# Patient Record
Sex: Female | Born: 1960
Health system: Southern US, Community
[De-identification: ages and names within clinical notes are randomized; demographics above are authoritative.]

## PROBLEM LIST (undated history)

## (undated) DIAGNOSIS — K760 Fatty (change of) liver, not elsewhere classified: Secondary | ICD-10-CM

## (undated) DIAGNOSIS — D649 Anemia, unspecified: Secondary | ICD-10-CM

## (undated) DIAGNOSIS — K8689 Other specified diseases of pancreas: Secondary | ICD-10-CM

## (undated) DIAGNOSIS — K219 Gastro-esophageal reflux disease without esophagitis: Secondary | ICD-10-CM

## (undated) DIAGNOSIS — R011 Cardiac murmur, unspecified: Secondary | ICD-10-CM

## (undated) DIAGNOSIS — E785 Hyperlipidemia, unspecified: Secondary | ICD-10-CM

## (undated) DIAGNOSIS — E039 Hypothyroidism, unspecified: Secondary | ICD-10-CM

## (undated) DIAGNOSIS — E119 Type 2 diabetes mellitus without complications: Secondary | ICD-10-CM

## (undated) DIAGNOSIS — E559 Vitamin D deficiency, unspecified: Secondary | ICD-10-CM

## (undated) DIAGNOSIS — M8580 Other specified disorders of bone density and structure, unspecified site: Secondary | ICD-10-CM

## (undated) HISTORY — DX: Other specified diseases of pancreas: K86.89

## (undated) HISTORY — DX: Vitamin D deficiency, unspecified: E55.9

## (undated) HISTORY — DX: Hyperlipidemia, unspecified: E78.5

## (undated) HISTORY — DX: Cardiac murmur, unspecified: R01.1

## (undated) HISTORY — PX: TUBAL LIGATION: SHX77

## (undated) HISTORY — DX: Gastro-esophageal reflux disease without esophagitis: K21.9

## (undated) HISTORY — DX: Fatty (change of) liver, not elsewhere classified: K76.0

---

## 2015-05-08 ENCOUNTER — Encounter: Payer: Self-pay | Admitting: Family

## 2015-05-08 ENCOUNTER — Ambulatory Visit (INDEPENDENT_AMBULATORY_CARE_PROVIDER_SITE_OTHER): Payer: BLUE CROSS/BLUE SHIELD | Admitting: Family

## 2015-05-08 VITALS — BP 110/78 | HR 73 | Temp 97.8°F | Resp 16 | Ht 62.5 in | Wt 167.2 lb

## 2015-05-08 DIAGNOSIS — R312 Other microscopic hematuria: Secondary | ICD-10-CM

## 2015-05-08 DIAGNOSIS — R3129 Other microscopic hematuria: Secondary | ICD-10-CM

## 2015-05-08 DIAGNOSIS — R109 Unspecified abdominal pain: Secondary | ICD-10-CM

## 2015-05-08 LAB — POCT URINALYSIS DIPSTICK
Bilirubin, UA: NEGATIVE
Glucose, UA: NEGATIVE
KETONES UA: NEGATIVE
Nitrite, UA: NEGATIVE
PH UA: 6.5
SPEC GRAV UA: 1.01
Urobilinogen, UA: 0.2

## 2015-05-08 NOTE — Assessment & Plan Note (Signed)
I suspect that abdominal pain is due to chronic constipation. We discussed increasing water intake, fresh fruits and veggies. Add some prn miralax as noted in AVS. Discussed titrating miralax with goal of 1 soft BM/day. Back off if diarrhea occurs.  If symptoms persist after constipation is treated, plan CT abd/pelvis.  I have asked the pt to follow up in 2 weeks.

## 2015-05-08 NOTE — Progress Notes (Signed)
Pre visit review using our clinic review tool, if applicable. No additional management support is needed unless otherwise documented below in the visit note. 

## 2015-05-08 NOTE — Progress Notes (Signed)
Subjective:    Patient ID: Bailey Harris, female    DOB: 11/05/1961, 54 y.o.   MRN: [id number]  HPI  Bailey Harris is a 54 yr old female who presents today to establish care. Presents with husband who assists in spanish translation. Pt reports chief complaint of left sided abdominal pain.  Pain has been present x 9 months.  Abdominal pain is intermittent. Reports that pain seems to be worse at night and after eating. Reports that pain is stinging in nature.  Reports that she does not take anything for the pain. Denies associated dysuria.  Reports that she does have hx of intermittent constipation.  Notes that pain can be worse if she is constipated.  Reports BM every every 2-3 days. Pain seems to be worsened by red meat. Admits to a diet lacking in fresh fruit/veggies. She reports that has seen blood on the toilet tissue 2-3 times in the past year. She reports + hx of hemorrhoids.  Reports normal colo 1 year ago.   Reports joint pain and muscle cramping x 6 mos.  Also reports bilateral hand pain in fingers.  She used to work as a Secretary/administrator. Reports that she stays busy with housework. On her feet.  Has some mild cramping in the legs and feet.  Notes that the muscle pain is worse now than when she stopped working due to moving from [place] one year ago.     Anxiety-  Reports + anxiety.  Occurs when she thinks about her parents who are deceased and brothers who are far.  Does not interfere with day to day. Denies depression.  Review of Systems  Constitutional: Negative for unexpected weight change.  HENT: Negative for rhinorrhea.   Respiratory: Negative for cough.   Gastrointestinal: Positive for constipation.  Genitourinary: Negative for dysuria.  Musculoskeletal: Positive for arthralgias.       Hands  Skin: Negative for rash.       Reports dry skin  Neurological: Positive for dizziness.  Hematological: Negative for adenopathy.  Psychiatric/Behavioral:       Reports mild swollen glands sometimes  in the AM then "goes away."    History reviewed. No pertinent past medical history.  History   Social History  . Marital Status: Married    Spouse Name: N/A  . Number of Children: N/A  . Years of Education: N/A   Occupational History  . Not on file.   Social History Main Topics  . Smoking status: Never Smoker   . Smokeless tobacco: Not on file  . Alcohol Use: No  . Drug Use: No  . Sexual Activity: Not on file   Other Topics Concern  . 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 Cesarean section      Family History  Problem Relation Age of Onset  . Cancer Mother     ?type  . Cancer Father     throat cancer    No Known Allergies  No current outpatient prescriptions on file prior to visit.   No current facility-administered medications on file prior to visit.  BP 110/78 mmHg  Pulse 73  Temp(Src) 97.8 F (36.6 C) (Oral)  Resp 16  Ht 5' 2.5" (1.588 m)  Wt 167 lb 3.2 oz (75.841 kg)  BMI 30.07 kg/m2  SpO2 98%  LMP 12/30/2010       Objective:   Physical Exam  Constitutional: She is oriented to person, place, and time. She appears well-developed and well-nourished.  HENT:  Head: Normocephalic and atraumatic.  Eyes: No scleral icterus.  Cardiovascular: Normal rate, regular rhythm and normal heart sounds.   No murmur heard. Pulmonary/Chest: Effort normal and breath sounds normal. No respiratory distress. She has no wheezes.  Abdominal: Soft. She exhibits no distension.  + tenderness to palpation LUQ and LLQ.  No palpable masses.   Musculoskeletal: She exhibits no edema.  Lymphadenopathy:    She has no cervical adenopathy.  Neurological: She is alert and oriented to person,  place, and time.  Skin: Skin is warm and dry.  Psychiatric: She has a normal mood and affect. Her behavior is normal. Judgment and thought content normal.          Assessment & Plan:  Plan RA/ANA/ESR next visit- lab is currently closed.

## 2015-05-08 NOTE — Patient Instructions (Signed)
Take miralax 1 cap in 8 ounce of water or juice today, then 1 tablespoon once daily as needed with goal of 1 soft BM a day. Follow up in 2 weeks.

## 2015-05-09 LAB — URINALYSIS, ROUTINE W REFLEX MICROSCOPIC
Bilirubin Urine: NEGATIVE
Ketones, ur: NEGATIVE
NITRITE: NEGATIVE
Specific Gravity, Urine: 1.015 (ref 1.000–1.030)
Total Protein, Urine: NEGATIVE
URINE GLUCOSE: NEGATIVE
UROBILINOGEN UA: 0.2 (ref 0.0–1.0)
pH: 7 (ref 5.0–8.0)

## 2015-05-11 ENCOUNTER — Encounter: Payer: Self-pay | Admitting: Family

## 2015-05-11 LAB — URINE CULTURE: Colony Count: 25000

## 2015-05-22 ENCOUNTER — Encounter: Payer: Self-pay | Admitting: Family

## 2015-05-22 ENCOUNTER — Ambulatory Visit (INDEPENDENT_AMBULATORY_CARE_PROVIDER_SITE_OTHER): Payer: BLUE CROSS/BLUE SHIELD | Admitting: Family

## 2015-05-22 VITALS — BP 110/60 | HR 72 | Temp 98.0°F | Resp 16 | Ht 62.5 in | Wt 171.0 lb

## 2015-05-22 DIAGNOSIS — Z0000 Encounter for general adult medical examination without abnormal findings: Secondary | ICD-10-CM

## 2015-05-22 DIAGNOSIS — M2550 Pain in unspecified joint: Secondary | ICD-10-CM

## 2015-05-22 DIAGNOSIS — R1084 Generalized abdominal pain: Secondary | ICD-10-CM

## 2015-05-22 LAB — POCT URINE HCG BY VISUAL COLOR COMPARISON TESTS: Preg Test, Ur: NEGATIVE

## 2015-05-22 NOTE — Progress Notes (Signed)
   Subjective:    Patient ID: Bailey Harris, female    DOB: July 03, 1961, 54 y.o.   MRN: [id number]  HPI  Bailey Harris is a 54 yr old female who presents today with her husband for follow up of her abdominal pain.  She was seen on 05/08/15 and described intermittent left sided abdominal pain.  We gave her recommendations for constipation to see if this would help.  She reports that she has been using the miralax as needed and notes that her left sided abdominal pain is much improved.  She does report hx of gallstones and notes some intermittent RUQ pain.   Reports colo 18 months ago and this was normal per patient.  She reports that she continues to have pain in hands/fingers, muscle cramping.   Review of Systems    see HPI  No past medical history on file.  History   Social History  . Marital Status: Married    Spouse Name: N/A  . Number of Children: N/A  . Years of Education: N/A   Occupational History  . Not on file.   Social History Main Topics  . Smoking status: Never Smoker   . Smokeless tobacco: Not on file  . Alcohol Use: No  . Drug Use: No  . Sexual Activity: Not on file   Other Topics Concern  . 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 Cesarean section      Family History  Problem Relation Age of Onset  . Cancer Mother     ?type  . Cancer Father     throat cancer    No Known Allergies  No current outpatient prescriptions on file prior to visit.   No current facility-administered medications on file prior to visit.    BP 110/60 mmHg  Pulse 72  Temp(Src) 98 F (36.7 C) (Oral)  Resp 16  Ht 5' 2.5" (1.588 m)  Wt 171 lb (77.565 kg)  BMI 30.76 kg/m2  SpO2 97%  LMP  12/30/2010    Objective:   Physical Exam  Constitutional: She is oriented to person, place, and time. She appears well-developed and well-nourished.  HENT:  Head: Normocephalic and atraumatic.  Cardiovascular: Normal rate, regular rhythm and normal heart sounds.   No murmur heard. Pulmonary/Chest: Effort normal and breath sounds normal. No respiratory distress. She has no wheezes.  Abdominal: Soft. Bowel sounds are normal. She exhibits no distension.  + murphy's sign Diffuse abdominal tenderness is noted.   Musculoskeletal: She exhibits no edema.  Neurological: She is alert and oriented to person, place, and time.  Psychiatric: She has a normal mood and affect. Her behavior is normal. Judgment and thought content normal.          Assessment & Plan:

## 2015-05-22 NOTE — Patient Instructions (Signed)
You will be contacted about scheduling your mammogram and CT scan tests.  Please schedule lab draw at the front desk. Schedule a complete physical at your convenience this summer.  Continue miralax as needed.

## 2015-05-22 NOTE — Progress Notes (Signed)
Pre visit review using our clinic review tool, if applicable. No additional management support is needed unless otherwise documented below in the visit note. 

## 2015-05-22 NOTE — Assessment & Plan Note (Signed)
She is still quite tender today despite improvement in her constipation symptoms. Recommend CT abdomen to look at GB and abd.  She is agreeable.

## 2015-05-22 NOTE — Assessment & Plan Note (Signed)
Obtain ANA, RA, ESR.  

## 2015-05-22 NOTE — Addendum Note (Signed)
Addended by: Kelle Darting A on: 05/22/2015 07:03 PM   Modules accepted: Orders

## 2015-05-30 ENCOUNTER — Ambulatory Visit (HOSPITAL_BASED_OUTPATIENT_CLINIC_OR_DEPARTMENT_OTHER)
Admission: RE | Admit: 2015-05-30 | Discharge: 2015-05-30 | Disposition: A | Discharge status: Home / Self Care | Payer: BLUE CROSS/BLUE SHIELD | Source: Ambulatory Visit | Attending: Family | Admitting: Family

## 2015-05-30 ENCOUNTER — Encounter (HOSPITAL_BASED_OUTPATIENT_CLINIC_OR_DEPARTMENT_OTHER): Payer: Self-pay

## 2015-05-30 DIAGNOSIS — K760 Fatty (change of) liver, not elsewhere classified: Secondary | ICD-10-CM | POA: Diagnosis not present

## 2015-05-30 DIAGNOSIS — R1011 Right upper quadrant pain: Secondary | ICD-10-CM | POA: Insufficient documentation

## 2015-05-30 DIAGNOSIS — Z1231 Encounter for screening mammogram for malignant neoplasm of breast: Secondary | ICD-10-CM | POA: Diagnosis present

## 2015-05-30 DIAGNOSIS — Z0000 Encounter for general adult medical examination without abnormal findings: Secondary | ICD-10-CM

## 2015-05-30 DIAGNOSIS — K838 Other specified diseases of biliary tract: Secondary | ICD-10-CM | POA: Insufficient documentation

## 2015-05-30 DIAGNOSIS — K5710 Diverticulosis of small intestine without perforation or abscess without bleeding: Secondary | ICD-10-CM | POA: Insufficient documentation

## 2015-05-30 DIAGNOSIS — R1084 Generalized abdominal pain: Secondary | ICD-10-CM

## 2015-05-30 MED ORDER — IOHEXOL 300 MG/ML  SOLN
100.0000 mL | Freq: Once | INTRAMUSCULAR | Status: AC | PRN
  Administered 2015-05-30: 100 mL via INTRAVENOUS

## 2015-05-31 ENCOUNTER — Telehealth: Payer: Self-pay | Admitting: Family

## 2015-05-31 ENCOUNTER — Encounter: Payer: Self-pay | Admitting: Family

## 2015-05-31 NOTE — Telephone Encounter (Signed)
Left message on voicemail for pt to return my call.

## 2015-05-31 NOTE — Telephone Encounter (Signed)
CT abd shows fatty liver. (rec low fat/low cholesterol diet). Gallbladder looks good. No gallstones noted.  If RUQ pain persists, I would recommend a HIDA scan to check gallbladder function.

## 2015-06-05 NOTE — Telephone Encounter (Signed)
Left message for pt to return my call.

## 2015-06-12 NOTE — Telephone Encounter (Signed)
Notified pt's husband and he voices understanding. 

## 2015-07-04 ENCOUNTER — Telehealth: Payer: Self-pay | Admitting: Family

## 2015-07-04 NOTE — Telephone Encounter (Signed)
pre visit letter mailed 07/04/15 °

## 2015-07-24 ENCOUNTER — Telehealth: Payer: Self-pay | Admitting: Family

## 2015-07-24 ENCOUNTER — Encounter: Payer: BLUE CROSS/BLUE SHIELD | Admitting: Family

## 2015-07-24 DIAGNOSIS — Z0289 Encounter for other administrative examinations: Secondary | ICD-10-CM

## 2015-07-28 NOTE — Telephone Encounter (Signed)
Yes please

## 2015-07-28 NOTE — Telephone Encounter (Signed)
Pt was no show 07/24/15 5:30pm, cpe appt, pt rescheduled for 08/07/15, charge for no show?

## 2015-08-07 ENCOUNTER — Telehealth: Payer: Self-pay | Admitting: Family

## 2015-08-07 ENCOUNTER — Encounter: Payer: BLUE CROSS/BLUE SHIELD | Admitting: Family

## 2015-08-07 NOTE — Telephone Encounter (Signed)
Pt will not be here tonight 08/07/15 5:15pm, 08/07/15 4:45pm family member left msg to cancel appt - called and spoke to family and pts ins needs to be straigtened out before they can come in, charge for no show?

## 2015-08-07 NOTE — Telephone Encounter (Signed)
No

## 2015-12-14 ENCOUNTER — Telehealth: Payer: Self-pay | Admitting: Behavioral Health

## 2015-12-14 NOTE — Telephone Encounter (Signed)
Unable to reach patient at time of Pre-Visit Call.  Left message for patient to return call when available.    

## 2015-12-15 ENCOUNTER — Encounter: Payer: Self-pay | Admitting: Family

## 2015-12-15 ENCOUNTER — Ambulatory Visit (INDEPENDENT_AMBULATORY_CARE_PROVIDER_SITE_OTHER): Payer: BLUE CROSS/BLUE SHIELD | Admitting: Family

## 2015-12-15 VITALS — BP 110/58 | HR 63 | Temp 98.2°F | Resp 16 | Ht 62.5 in | Wt 168.4 lb

## 2015-12-15 DIAGNOSIS — G5601 Carpal tunnel syndrome, right upper limb: Secondary | ICD-10-CM

## 2015-12-15 DIAGNOSIS — Z1159 Encounter for screening for other viral diseases: Secondary | ICD-10-CM

## 2015-12-15 DIAGNOSIS — Z0001 Encounter for general adult medical examination with abnormal findings: Secondary | ICD-10-CM

## 2015-12-15 DIAGNOSIS — G5600 Carpal tunnel syndrome, unspecified upper limb: Secondary | ICD-10-CM | POA: Insufficient documentation

## 2015-12-15 DIAGNOSIS — Z23 Encounter for immunization: Secondary | ICD-10-CM | POA: Diagnosis not present

## 2015-12-15 DIAGNOSIS — Z0000 Encounter for general adult medical examination without abnormal findings: Secondary | ICD-10-CM | POA: Diagnosis not present

## 2015-12-15 LAB — HEPATIC FUNCTION PANEL
ALK PHOS: 87 U/L (ref 39–117)
ALT: 52 U/L — ABNORMAL HIGH (ref 0–35)
AST: 31 U/L (ref 0–37)
Albumin: 4.4 g/dL (ref 3.5–5.2)
BILIRUBIN DIRECT: 0.2 mg/dL (ref 0.0–0.3)
Total Bilirubin: 1.3 mg/dL — ABNORMAL HIGH (ref 0.2–1.2)
Total Protein: 7.7 g/dL (ref 6.0–8.3)

## 2015-12-15 LAB — BASIC METABOLIC PANEL
BUN: 10 mg/dL (ref 6–23)
CALCIUM: 9.6 mg/dL (ref 8.4–10.5)
CO2: 28 mEq/L (ref 19–32)
Chloride: 107 mEq/L (ref 96–112)
Creatinine, Ser: 0.59 mg/dL (ref 0.40–1.20)
GFR: 113.21 mL/min (ref >60.00)
GLUCOSE: 124 mg/dL — AB (ref 70–99)
Potassium: 4.3 mEq/L (ref 3.5–5.1)
Sodium: 142 mEq/L (ref 135–145)

## 2015-12-15 LAB — CBC WITH DIFFERENTIAL/PLATELET
BASOS ABS: 0 10*3/uL (ref 0.0–0.1)
Basophils Relative: 0.5 % (ref 0.0–3.0)
EOS ABS: 0.1 10*3/uL (ref 0.0–0.7)
Eosinophils Relative: 1.4 % (ref 0.0–5.0)
HCT: 40.7 % (ref 36.0–46.0)
Hemoglobin: 13.6 g/dL (ref 12.0–15.0)
LYMPHS ABS: 1.4 10*3/uL (ref 0.7–4.0)
Lymphocytes Relative: 32 % (ref 12.0–46.0)
MCHC: 33.4 g/dL (ref 30.0–36.0)
MCV: 92.8 fl (ref 78.0–100.0)
MONO ABS: 0.4 10*3/uL (ref 0.1–1.0)
Monocytes Relative: 9.2 % (ref 3.0–12.0)
NEUTROS PCT: 56.9 % (ref 43.0–77.0)
Neutro Abs: 2.5 10*3/uL (ref 1.4–7.7)
Platelets: 247 10*3/uL (ref 150.0–400.0)
RBC: 4.39 Mil/uL (ref 3.87–5.11)
RDW: 12.4 % (ref 11.5–15.5)
WBC: 4.4 10*3/uL (ref 4.0–10.5)

## 2015-12-15 LAB — URINALYSIS, ROUTINE W REFLEX MICROSCOPIC
BILIRUBIN URINE: NEGATIVE
KETONES UR: NEGATIVE
Nitrite: POSITIVE — AB
Specific Gravity, Urine: 1.025 (ref 1.000–1.030)
TOTAL PROTEIN, URINE-UPE24: NEGATIVE
Urine Glucose: NEGATIVE
Urobilinogen, UA: 0.2 (ref 0.0–1.0)
pH: 6 (ref 5.0–8.0)

## 2015-12-15 LAB — LIPID PANEL
CHOLESTEROL: 211 mg/dL — AB (ref 0–200)
HDL: 30.6 mg/dL — ABNORMAL LOW (ref >39.00)
LDL CALC: 148 mg/dL — AB (ref 0–99)
NonHDL: 180.66
Total CHOL/HDL Ratio: 7
Triglycerides: 164 mg/dL — ABNORMAL HIGH (ref 0.0–149.0)
VLDL: 32.8 mg/dL (ref 0.0–40.0)

## 2015-12-15 LAB — TSH: TSH: 4.59 u[IU]/mL — ABNORMAL HIGH (ref 0.35–4.50)

## 2015-12-15 MED ORDER — ASPIRIN EC 81 MG PO TBEC: 81.0000 mg | DELAYED_RELEASE_TABLET | Freq: Every day | ORAL | Status: DC

## 2015-12-15 MED ORDER — MELOXICAM 7.5 MG PO TABS: 7.5000 mg | ORAL_TABLET | Freq: Every day | ORAL | Status: DC

## 2015-12-15 NOTE — Assessment & Plan Note (Addendum)
New. Trial of meloxicam and wrist brace.

## 2015-12-15 NOTE — Patient Instructions (Signed)
Please schedule dental exam. Wear right wrist brace while sleeping and as able during the day. Start meloxicam (anti-inflammatory) once daily to see if this helps with hand/wrist pain and numbness.  Let me know if symptoms are not improved in 1 month.

## 2015-12-15 NOTE — Progress Notes (Signed)
Pre visit review using our clinic review tool, if applicable. No additional management support is needed unless otherwise documented below in the visit note. 

## 2015-12-15 NOTE — Assessment & Plan Note (Signed)
Discussed importance of healthy diet and exercise. Obtain routine labs. Advised pt to add asa 81mg  once daily for CV risk reduction due to strong family hx of early stroke deaths. Obtain lipid panel.

## 2015-12-15 NOTE — Progress Notes (Signed)
Subjective:    Patient ID: Bailey Harris, female    DOB: February 07, 1961, 54 y.o.   MRN: [id number]  HPI  Patient presents today for complete physical.  Immunizations: tetanus up to date, flu shot today Diet: reports diet is "fair" Exercise: Not exercising.  Colonoscopy: 2015 Dexa: menopausal x 4 yrs Pap Smear: 2015- normal Mammogram: 5/16 [facility]- last February Dental- due  Wt Readings from Last 3 Encounters:  12/15/15 168 lb 6.4 oz (76.386 kg)  05/22/15 171 lb (77.565 kg)  05/08/15 167 lb 3.2 oz (75.841 kg)     Review of Systems  Constitutional: Negative for unexpected weight change.  Eyes:       Wears glasses  Respiratory: Negative for cough.   Cardiovascular: Negative for chest pain.  Gastrointestinal:       Some positional gerd symptoms  Genitourinary:       Mild intermittent dysuria.   Musculoskeletal:       Occasional low back pain  Skin:       Some dry skin  Neurological: Negative for headaches.  Hematological: Negative for adenopathy.  Psychiatric/Behavioral:       Denies depression/anxiety       History reviewed. No pertinent past medical history.  Social History   Social History  . Marital Status: Married    Spouse Name: N/A  . Number of Children: N/A  . Years of Education: N/A   Occupational History  . Not on file.   Social History Main Topics  . Smoking status: Never Smoker   . Smokeless tobacco: Not on file  . Alcohol Use: No  . Drug Use: No  . Sexual Activity: Not on file   Other Topics Concern  . 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 Cesarean section      Family History  Problem Relation Age of Onset  . Cancer Mother     ?type  . Cancer Father      throat cancer    No Known Allergies  No current outpatient prescriptions on file prior to visit.   No current facility-administered medications on file prior to visit.    BP 110/58 mmHg  Pulse 63  Temp(Src) 98.2 F (36.8 C) (Oral)  Resp 16  Ht 5' 2.5" (1.588 m)  Wt 168 lb 6.4 oz (76.386 kg)  BMI 30.29 kg/m2  SpO2 100%  LMP 12/30/2010    Objective:   Physical Exam Physical Exam  Constitutional: She is oriented to person, place, and time. She appears well-developed and well-nourished. No distress.  HENT:  Head: Normocephalic and atraumatic.  Right Ear: Tympanic membrane and ear canal normal.  Left Ear: Tympanic membrane and ear canal normal.  Mouth/Throat: Oropharynx is clear and moist.  Eyes: Pupils are equal, round, and reactive to light. No scleral icterus.  Neck: Normal range of motion. No thyromegaly present.  Cardiovascular: Normal rate and regular rhythm.   No murmur heard. Pulmonary/Chest: Effort normal and breath sounds normal. No respiratory distress. He has no wheezes. She has no rales. She exhibits no tenderness.  Abdominal: Soft. Bowel sounds are normal. He exhibits  no distension and no mass. There is no tenderness. There is no rebound and no guarding.  Musculoskeletal: She exhibits no edema.  Lymphadenopathy:    She has no cervical adenopathy.  Neurological: She is alert and oriented to person, place, and time. She has normal patellar reflexes. She exhibits normal muscle tone. Coordination normal. + tinel, + phalans- right side Skin: Skin is warm and dry.  Psychiatric: She has a normal mood and affect. Her behavior is normal. Judgment and thought content normal.  Breasts: Examined lying Right: Without masses, retractions, discharge or axillary adenopathy.  Left: Without masses, retractions, discharge or axillary adenopathy.           Assessment & Plan:          Assessment & Plan:  EKG tracing is personally reviewed.  EKG notes NSR.  No  acute changes.

## 2015-12-15 NOTE — Addendum Note (Signed)
Addended by: Debbrah Alar on: 12/15/2015 03:07 PM   Modules accepted: Miquel Dunn

## 2015-12-16 LAB — HEPATITIS C ANTIBODY: HCV Ab: NEGATIVE

## 2015-12-17 ENCOUNTER — Telehealth: Payer: Self-pay | Admitting: Family

## 2015-12-17 DIAGNOSIS — E039 Hypothyroidism, unspecified: Secondary | ICD-10-CM

## 2015-12-17 DIAGNOSIS — K760 Fatty (change of) liver, not elsewhere classified: Secondary | ICD-10-CM | POA: Insufficient documentation

## 2015-12-17 DIAGNOSIS — N390 Urinary tract infection, site not specified: Secondary | ICD-10-CM

## 2015-12-17 MED ORDER — NITROFURANTOIN MONOHYD MACRO 100 MG PO CAPS: 100.0000 mg | ORAL_CAPSULE | Freq: Two times a day (BID) | ORAL | Status: DC

## 2015-12-17 NOTE — Telephone Encounter (Signed)
Liver function test is mildly elevated.  Likely due to fatty liver.  Work on low fat/low cholesterol diet, exercise and weight  Loss.    Urine shows possible UTI.  Start macrodantin. Repeat UA in 1 month dx UTI,  Also, thyroid may be slightly underactive, I would like to repeat TSH, free t3, fre t4 in 1 month dx hypothyroid.

## 2015-12-17 NOTE — Telephone Encounter (Signed)
Hep C testing is negative.   Sugar is mildly elevated, please ask lab to add on a1C  Dx hyperglycemia. thanks

## 2015-12-18 ENCOUNTER — Other Ambulatory Visit (INDEPENDENT_AMBULATORY_CARE_PROVIDER_SITE_OTHER): Payer: BLUE CROSS/BLUE SHIELD

## 2015-12-18 DIAGNOSIS — R7309 Other abnormal glucose: Secondary | ICD-10-CM

## 2015-12-18 DIAGNOSIS — R739 Hyperglycemia, unspecified: Secondary | ICD-10-CM

## 2015-12-18 LAB — HEMOGLOBIN A1C: HEMOGLOBIN A1C: 6.6 % — AB (ref 4.6–6.5)

## 2015-12-18 NOTE — Telephone Encounter (Signed)
Add on form faxed to the lab. Awaiting result.

## 2015-12-18 NOTE — Telephone Encounter (Signed)
A1C shows + diabetes.  Advise pt on diabetic diet: specificallywork on avoiding concentrated sweets, and limiting white carbs (rice/bread/pasta/potatoes). Instead substitute whole grain versions with reasonable portions. Also, work on weight loss and exercise. Follow up in 3 months.

## 2015-12-18 NOTE — Telephone Encounter (Signed)
Left message for Heliodoro to return my call.

## 2015-12-19 ENCOUNTER — Telehealth: Payer: Self-pay | Admitting: Family

## 2015-12-19 NOTE — Telephone Encounter (Signed)
LVM for pt to return call/ To schedule Bone Density

## 2016-01-02 NOTE — Telephone Encounter (Signed)
Notified pt's spouse of below results and he voices understanding. Lab appt scheduled for 02/02/16 at 7am and future orders entered. 3 month f/u scheduled with Melissa for 04/05/16 at 7:45am. He requests that appts be mailed to him as he is currently driving. Below documentation mailed to pt/spouse.  Below results/instructions mailed. Pt's spouse will let us know if pt never completed antibiotic from 11/2015.

## 2016-02-02 ENCOUNTER — Other Ambulatory Visit (INDEPENDENT_AMBULATORY_CARE_PROVIDER_SITE_OTHER): Payer: BLUE CROSS/BLUE SHIELD

## 2016-02-02 ENCOUNTER — Telehealth: Payer: Self-pay | Admitting: Family

## 2016-02-02 DIAGNOSIS — R319 Hematuria, unspecified: Secondary | ICD-10-CM

## 2016-02-02 DIAGNOSIS — N390 Urinary tract infection, site not specified: Secondary | ICD-10-CM | POA: Diagnosis not present

## 2016-02-02 DIAGNOSIS — E039 Hypothyroidism, unspecified: Secondary | ICD-10-CM | POA: Diagnosis not present

## 2016-02-02 LAB — URINALYSIS, ROUTINE W REFLEX MICROSCOPIC
Bilirubin Urine: NEGATIVE
Ketones, ur: NEGATIVE
Nitrite: NEGATIVE
Specific Gravity, Urine: 1.025 (ref 1.000–1.030)
TOTAL PROTEIN, URINE-UPE24: NEGATIVE
Urine Glucose: NEGATIVE
Urobilinogen, UA: 0.2 (ref 0.0–1.0)
pH: 6 (ref 5.0–8.0)

## 2016-02-02 LAB — T4, FREE: FREE T4: 0.66 ng/dL (ref 0.60–1.60)

## 2016-02-02 LAB — T3, FREE: T3, Free: 3.2 pg/mL (ref 2.3–4.2)

## 2016-02-02 LAB — TSH: TSH: 4.22 u[IU]/mL (ref 0.35–4.50)

## 2016-02-02 MED ORDER — SULFAMETHOXAZOLE-TRIMETHOPRIM 800-160 MG PO TABS: 1.0000 | ORAL_TABLET | Freq: Two times a day (BID) | ORAL | Status: DC

## 2016-02-02 NOTE — Telephone Encounter (Signed)
UA shows blood, probable UTI. I would like to treat her with antibiotics.  Repeat UA in 2 weeks.  If still + blood, I will refer to urology at that time. rx sent for bactrim.

## 2016-02-02 NOTE — Telephone Encounter (Signed)
Notified pt's spouse and he voices understanding. Lab appt scheduled for 02/15/16 at 7am. Future order entered.

## 2016-02-15 ENCOUNTER — Other Ambulatory Visit (INDEPENDENT_AMBULATORY_CARE_PROVIDER_SITE_OTHER): Payer: BLUE CROSS/BLUE SHIELD

## 2016-02-15 DIAGNOSIS — R319 Hematuria, unspecified: Secondary | ICD-10-CM

## 2016-02-16 ENCOUNTER — Encounter: Payer: Self-pay | Admitting: Family

## 2016-02-16 LAB — URINALYSIS, MICROSCOPIC ONLY
Bacteria, UA: NONE SEEN [HPF]
Casts: NONE SEEN [LPF]
Crystals: NONE SEEN [HPF]
Yeast: NONE SEEN [HPF]

## 2016-02-16 LAB — URINALYSIS, ROUTINE W REFLEX MICROSCOPIC
BILIRUBIN URINE: NEGATIVE
Glucose, UA: NEGATIVE
Hgb urine dipstick: NEGATIVE
KETONES UR: NEGATIVE
NITRITE: NEGATIVE
Protein, ur: NEGATIVE
Specific Gravity, Urine: 1.024 (ref 1.001–1.035)
pH: 5.5 (ref 5.0–8.0)

## 2016-04-05 ENCOUNTER — Ambulatory Visit: Payer: BLUE CROSS/BLUE SHIELD | Admitting: Family

## 2016-04-22 ENCOUNTER — Ambulatory Visit (INDEPENDENT_AMBULATORY_CARE_PROVIDER_SITE_OTHER): Payer: BLUE CROSS/BLUE SHIELD | Admitting: Family

## 2016-04-22 ENCOUNTER — Encounter: Payer: Self-pay | Admitting: Family

## 2016-04-22 VITALS — BP 110/70 | HR 72 | Temp 98.3°F | Resp 16 | Ht 63.0 in | Wt 172.2 lb

## 2016-04-22 DIAGNOSIS — E785 Hyperlipidemia, unspecified: Secondary | ICD-10-CM | POA: Diagnosis not present

## 2016-04-22 DIAGNOSIS — E118 Type 2 diabetes mellitus with unspecified complications: Secondary | ICD-10-CM | POA: Insufficient documentation

## 2016-04-22 DIAGNOSIS — E119 Type 2 diabetes mellitus without complications: Secondary | ICD-10-CM

## 2016-04-22 DIAGNOSIS — Z0000 Encounter for general adult medical examination without abnormal findings: Secondary | ICD-10-CM

## 2016-04-22 DIAGNOSIS — M545 Low back pain, unspecified: Secondary | ICD-10-CM | POA: Insufficient documentation

## 2016-04-22 DIAGNOSIS — G5601 Carpal tunnel syndrome, right upper limb: Secondary | ICD-10-CM | POA: Diagnosis not present

## 2016-04-22 HISTORY — DX: Type 2 diabetes mellitus with unspecified complications: E11.8

## 2016-04-22 MED ORDER — MELOXICAM 7.5 MG PO TABS: 7.5000 mg | ORAL_TABLET | Freq: Every day | ORAL | Status: DC

## 2016-04-22 NOTE — Assessment & Plan Note (Signed)
Discussed healthy diet, exercise.  Obtain a1c, urine microalbumin.

## 2016-04-22 NOTE — Progress Notes (Signed)
Subjective:    Patient ID: Bailey Harris, female    DOB: August 03, 1961, 55 y.o.   MRN: [id number]  HPI  Bailey Harris is a 55 yr old female who presents today for cpx.   1) DM2- Trying to work on diet.   Lab Results  Component Value Date   HGBA1C 6.6* 12/18/2015   Lab Results  Component Value Date   LDLCALC 148* 12/15/2015   CREATININE 0.59 12/15/2015   2) Back pain- reports 3 week history of low back pain.  Has been bothering her off/on x 2 months.  Uses advil prn.  Denies bowel/bladder incontinence.  Occasional pressure in her lower back especially with standing.    3) R hand pain- reports some numbness/pain into the right middle finger. Using wrist brace with some improvement, not resolved.   Review of Systems    see HPI  No past medical history on file.   Social History   Social History  . Marital Status: Married    Spouse Name: N/A  . Number of Children: N/A  . Years of Education: N/A   Occupational History  . Not on file.   Social History Main Topics  . Smoking status: Never Smoker   . Smokeless tobacco: Not on file  . Alcohol Use: No  . Drug Use: No  . Sexual Activity: Not on file   Other Topics Concern  . 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 Cesarean section      Family History  Problem Relation Age of Onset  . Cancer Mother     died at 83 from [place] (pt unsure what type of cancer)  . Cancer Father     throat cancer  . CVA Father     died age 47  . CVA Sister     10- died  . CVA Brother     5 died    No Known Allergies  Current Outpatient Prescriptions on File Prior to Visit  Medication Sig Dispense Refill  . aspirin EC 81 MG tablet Take 1 tablet (81 mg total)  by mouth daily.     No current facility-administered medications on file prior to visit.    BP 110/70 mmHg  Pulse 72  Temp(Src) 98.3 F (36.8 C) (Oral)  Resp 16  Ht _0  (1.6 m)  Wt 172 lb 3.2 oz (78.109 kg)  BMI 30.51 kg/m2  SpO2 97%  LMP 12/30/2010    Objective:   Physical Exam  Constitutional: She is oriented to person, place, and time. She appears well-developed and well-nourished.  Cardiovascular: Normal rate, regular rhythm and normal heart sounds.   No murmur heard. Pulmonary/Chest: Effort normal and breath sounds normal. No respiratory distress. She has no wheezes.  Musculoskeletal:       Lumbar back: She exhibits no tenderness.  +left  low back tenderness to palpation.    Neurological: She is alert and oriented to person, place, and time.  Psychiatric: She has a normal mood and affect. Her behavior is normal. Judgment and thought content normal.  Assessment & Plan:

## 2016-04-22 NOTE — Progress Notes (Signed)
Pre visit review using our clinic review tool, if applicable. No additional management support is needed unless otherwise documented below in the visit note. 

## 2016-04-22 NOTE — Assessment & Plan Note (Signed)
Obtain follow up lipid panel. If LDL remains >100 plan to begin statin.

## 2016-04-22 NOTE — Assessment & Plan Note (Signed)
Musculoskeletal pain. Trial of meloxicam.

## 2016-04-22 NOTE — Patient Instructions (Signed)
Please schedule a lab draw at the front desk. Begin meloxicam for back pain and right hand (carpal tunnel). Wear right wrist brace at night and as able during the day. Call me if back pain or right hand pain is not improved in 2 weeks.

## 2016-04-22 NOTE — Assessment & Plan Note (Signed)
Advised pt on trial of meloxicam, continue brace, if symptoms worsen or do not improve plan referral to hand specialist.

## 2016-05-01 ENCOUNTER — Ambulatory Visit (INDEPENDENT_AMBULATORY_CARE_PROVIDER_SITE_OTHER): Payer: BLUE CROSS/BLUE SHIELD | Admitting: Family Medicine

## 2016-05-01 ENCOUNTER — Encounter: Payer: Self-pay | Admitting: Family Medicine

## 2016-05-01 VITALS — BP 123/76 | HR 70 | Ht 63.0 in | Wt 168.0 lb

## 2016-05-01 DIAGNOSIS — Z0000 Encounter for general adult medical examination without abnormal findings: Secondary | ICD-10-CM

## 2016-05-01 DIAGNOSIS — Z1239 Encounter for other screening for malignant neoplasm of breast: Secondary | ICD-10-CM

## 2016-05-01 DIAGNOSIS — Z1151 Encounter for screening for human papillomavirus (HPV): Secondary | ICD-10-CM

## 2016-05-01 DIAGNOSIS — Z124 Encounter for screening for malignant neoplasm of cervix: Secondary | ICD-10-CM

## 2016-05-01 DIAGNOSIS — Z01419 Encounter for gynecological examination (general) (routine) without abnormal findings: Secondary | ICD-10-CM

## 2016-05-01 NOTE — Progress Notes (Signed)
Interpreter [phone] used for visit. Kathrene Alu RN BSN

## 2016-05-01 NOTE — Progress Notes (Addendum)
  Subjective:    Bailey Harris is a 55 y.o. female who presents for an annual exam. The patient has no complaints today. The patient is sexually active. GYN screening history: last pap: was normal and last mammogram: was normal. The patient wears seatbelts: yes. The patient participates in regular exercise: no. Has the patient ever been transfused or tattooed?: no.   Family history of Ovarian Cancer  Menstrual History: OB History    Gravida Para Term Preterm AB TAB SAB Ectopic Multiple Living   4 3   1  1   3        Patient's last menstrual period was 12/30/2010.    The following portions of the patient's history were reviewed and updated as appropriate: allergies, current medications, past family history, past medical history, past social history, past surgical history and problem list.  Review of Systems Pertinent items are noted in HPI.    Objective:      General Appearance:    Alert, cooperative, no distress, appears stated age  Head:    Normocephalic, without obvious abnormality, atraumatic  Eyes:    PERRL, conjunctiva/corneas clear, EOM's intact, fundi    benign, both eyes  Ears:    Normal TM's and external ear canals, both ears  Neck:   Supple, symmetrical, trachea midline, no adenopathy;    thyroid:  no enlargement/tenderness/nodules; no carotid   bruit or JVD  Back:     Symmetric, no curvature, ROM normal, no CVA tenderness  Lungs:     Clear to auscultation bilaterally, respirations unlabored  Chest Wall:    No tenderness or deformity   Heart:    Regular rate and rhythm, S1 and S2 normal, no murmur, rub   or gallop  Breast Exam:    No tenderness, masses, or nipple abnormality  Abdomen:     Soft, non-tender, bowel sounds active all four quadrants,    no masses, no organomegaly  Genitalia:    Normal female without lesion, discharge or tenderness.            Normal vaginal rugae, no lesions.  Cervix normal in           appearance with slightly stenosis.  Uterus not  enlarged.      Adnexa normal, without masses or fullness.  Extremities:   Extremities normal, atraumatic, no cyanosis or edema  Pulses:   2+ and symmetric all extremities  Skin:   Skin color, texture, turgor normal, no rashes or lesions  Lymph nodes:   Cervical, supraclavicular, and axillary nodes normal  Neurologic:   CNII-XII intact, normal strength, sensation and reflexes    throughout   .    Assessment:    Healthy female exam.    Plan:     All questions answered. Follow up in 1 year. Mammogram. Pap smear.   Family Cancer survey given to patient to see if qualifies for genetic screening

## 2016-05-01 NOTE — Addendum Note (Signed)
Addended by: Phill Myron on: 05/01/2016 03:30 PM   Modules accepted: Orders

## 2016-05-02 ENCOUNTER — Other Ambulatory Visit: Payer: BLUE CROSS/BLUE SHIELD

## 2016-05-02 LAB — HEMOGLOBIN A1C: Hgb A1c MFr Bld: 6.7 % — ABNORMAL HIGH (ref 4.6–6.5)

## 2016-05-02 LAB — LIPID PANEL
CHOLESTEROL: 182 mg/dL (ref 0–200)
HDL: 33.4 mg/dL — ABNORMAL LOW (ref >39.00)
LDL CALC: 123 mg/dL — AB (ref 0–99)
NonHDL: 148.15
Total CHOL/HDL Ratio: 5
Triglycerides: 126 mg/dL (ref 0.0–149.0)
VLDL: 25.2 mg/dL (ref 0.0–40.0)

## 2016-05-02 LAB — MICROALBUMIN / CREATININE URINE RATIO
Creatinine,U: 138.8 mg/dL
MICROALB UR: 0.7 mg/dL (ref 0.0–1.9)
Microalb Creat Ratio: 0.5 mg/g (ref 0.0–30.0)

## 2016-05-03 ENCOUNTER — Telehealth: Payer: Self-pay | Admitting: Family

## 2016-05-03 LAB — CYTOLOGY - PAP

## 2016-05-03 MED ORDER — ATORVASTATIN CALCIUM 20 MG PO TABS: 20.0000 mg | ORAL_TABLET | Freq: Every day | ORAL | Status: DC

## 2016-05-03 NOTE — Telephone Encounter (Signed)
Notified pt's spouse and he voices understanding. 

## 2016-05-03 NOTE — Telephone Encounter (Signed)
Please let pt's husband know that I reviewed her lab work. Cholesterol is elevated. Advise pt to begin atorvastatin. Also, sugar still showing diabetes but is at goal.  Continue diabetic diet, exercise.

## 2016-05-07 ENCOUNTER — Ambulatory Visit (HOSPITAL_BASED_OUTPATIENT_CLINIC_OR_DEPARTMENT_OTHER)
Admission: RE | Admit: 2016-05-07 | Discharge: 2016-05-07 | Disposition: A | Discharge status: Home / Self Care | Payer: BLUE CROSS/BLUE SHIELD | Source: Ambulatory Visit | Attending: Family Medicine | Admitting: Family Medicine

## 2016-05-07 DIAGNOSIS — Z1239 Encounter for other screening for malignant neoplasm of breast: Secondary | ICD-10-CM

## 2016-06-13 ENCOUNTER — Telehealth: Payer: Self-pay | Admitting: Medical

## 2016-06-13 ENCOUNTER — Ambulatory Visit (HOSPITAL_BASED_OUTPATIENT_CLINIC_OR_DEPARTMENT_OTHER)
Admission: RE | Admit: 2016-06-13 | Discharge: 2016-06-13 | Disposition: A | Discharge status: Home / Self Care | Payer: BLUE CROSS/BLUE SHIELD | Source: Ambulatory Visit | Attending: Family Medicine | Admitting: Family Medicine

## 2016-06-13 ENCOUNTER — Ambulatory Visit (HOSPITAL_BASED_OUTPATIENT_CLINIC_OR_DEPARTMENT_OTHER)
Admission: RE | Admit: 2016-06-13 | Discharge: 2016-06-13 | Disposition: A | Discharge status: Home / Self Care | Payer: BLUE CROSS/BLUE SHIELD | Source: Ambulatory Visit | Attending: Family | Admitting: Family

## 2016-06-13 ENCOUNTER — Telehealth: Payer: Self-pay

## 2016-06-13 DIAGNOSIS — Z0000 Encounter for general adult medical examination without abnormal findings: Secondary | ICD-10-CM | POA: Diagnosis not present

## 2016-06-13 DIAGNOSIS — Z13820 Encounter for screening for osteoporosis: Secondary | ICD-10-CM | POA: Diagnosis not present

## 2016-06-13 DIAGNOSIS — M8580 Other specified disorders of bone density and structure, unspecified site: Secondary | ICD-10-CM

## 2016-06-13 DIAGNOSIS — Z1231 Encounter for screening mammogram for malignant neoplasm of breast: Secondary | ICD-10-CM | POA: Insufficient documentation

## 2016-06-13 DIAGNOSIS — M8588 Other specified disorders of bone density and structure, other site: Secondary | ICD-10-CM | POA: Diagnosis not present

## 2016-06-13 DIAGNOSIS — Z780 Asymptomatic menopausal state: Secondary | ICD-10-CM | POA: Insufficient documentation

## 2016-06-13 NOTE — Telephone Encounter (Signed)
See DEXA result note.

## 2016-06-13 NOTE — Telephone Encounter (Signed)
Future order vit d. Placed.Have her get within a week.

## 2016-06-20 ENCOUNTER — Other Ambulatory Visit (INDEPENDENT_AMBULATORY_CARE_PROVIDER_SITE_OTHER): Payer: BLUE CROSS/BLUE SHIELD

## 2016-06-20 DIAGNOSIS — M8580 Other specified disorders of bone density and structure, unspecified site: Secondary | ICD-10-CM

## 2016-06-20 LAB — VITAMIN D 25 HYDROXY (VIT D DEFICIENCY, FRACTURES): VITD: 17.71 ng/mL — ABNORMAL LOW (ref 30.00–100.00)

## 2016-06-24 ENCOUNTER — Telehealth: Payer: Self-pay | Admitting: *Deleted

## 2016-06-24 MED ORDER — VITAMIN D (ERGOCALCIFEROL) 1.25 MG (50000 UNIT) PO CAPS: 50000.0000 [IU] | ORAL_CAPSULE | ORAL | Status: DC

## 2016-06-24 NOTE — Telephone Encounter (Signed)
That's OK, we will see how she looks on the follow up level.

## 2016-06-24 NOTE — Telephone Encounter (Signed)
Melissa-- I notified pt's spouse of below results but did not mention the otc vitamin D. I scheduled pt lab appt in 6 weeks to recheck vitamin D level. Do I need to call pt back to have her do otc vitamin D at this time?  Notes Recorded by Mosie Lukes, MD on 06/20/2016 at 10:57 PM Labs reveal deficiency. Start on Vitamin D 50000 IU caps, 1 cap po weekly x 12 weeks. Disp #4 with 4 rf. Also take daily Vitamin D over the counter. If already taking a daily supplement increase by 1000 IU daily and if not start Vitamin D 2000 IU daily.

## 2016-06-26 ENCOUNTER — Telehealth: Payer: Self-pay | Admitting: Family

## 2016-06-26 NOTE — Telephone Encounter (Signed)
Spoke with pt's spouse. He wanted to know about pt's cholesterol. Advised him that cholesterol was not checked at this visit and reviewed previous lab from 05/03/16.

## 2016-06-26 NOTE — Telephone Encounter (Signed)
Pt called returning RN's call for lab results.    CB: [phone]

## 2016-06-26 NOTE — Telephone Encounter (Signed)
Bailey Alar, NP at 06/24/2016 7:08 PM     Status: Signed       Expand All Collapse All   That's OK, we will see how she looks on the follow up level.             Ronny Flurry, CMA at 06/24/2016 6:17 PM     Status: Signed       Expand All Collapse All   Bailey Harris-- I notified pt's spouse of below results but did not mention the otc vitamin D. I scheduled pt lab appt in 6 weeks to recheck vitamin D level. Do I need to call pt back to have her do otc vitamin D at this time?  Notes Recorded by Mosie Lukes, MD on 06/20/2016 at 10:57 PM Labs reveal deficiency. Start on Vitamin D 50000 IU caps, 1 cap po weekly x 12 weeks. Disp #4 with 4 rf. Also take daily Vitamin D over the counter. If already taking a daily supplement increase by 1000 IU daily and if not start Vitamin D 2000 IU daily.

## 2016-07-22 ENCOUNTER — Ambulatory Visit: Payer: BLUE CROSS/BLUE SHIELD | Admitting: Family

## 2016-07-29 ENCOUNTER — Ambulatory Visit (INDEPENDENT_AMBULATORY_CARE_PROVIDER_SITE_OTHER): Payer: BLUE CROSS/BLUE SHIELD | Admitting: Family

## 2016-07-29 ENCOUNTER — Encounter: Payer: Self-pay | Admitting: Family

## 2016-07-29 VITALS — BP 118/76 | HR 74 | Temp 98.0°F | Resp 16 | Ht 63.0 in | Wt 166.6 lb

## 2016-07-29 DIAGNOSIS — Z23 Encounter for immunization: Secondary | ICD-10-CM

## 2016-07-29 DIAGNOSIS — E119 Type 2 diabetes mellitus without complications: Secondary | ICD-10-CM | POA: Diagnosis not present

## 2016-07-29 DIAGNOSIS — E785 Hyperlipidemia, unspecified: Secondary | ICD-10-CM | POA: Diagnosis not present

## 2016-07-29 DIAGNOSIS — E1142 Type 2 diabetes mellitus with diabetic polyneuropathy: Secondary | ICD-10-CM | POA: Diagnosis not present

## 2016-07-29 DIAGNOSIS — E559 Vitamin D deficiency, unspecified: Secondary | ICD-10-CM

## 2016-07-29 DIAGNOSIS — G5601 Carpal tunnel syndrome, right upper limb: Secondary | ICD-10-CM

## 2016-07-29 MED ORDER — MELOXICAM 7.5 MG PO TABS: 7.5000 mg | ORAL_TABLET | Freq: Every day | ORAL | 0 refills | Status: DC

## 2016-07-29 NOTE — Assessment & Plan Note (Signed)
Discussed importance of good DM control.

## 2016-07-29 NOTE — Assessment & Plan Note (Signed)
At goal, continue diabetic diet. Pneumovax today, refer for diabetic eye exzm.

## 2016-07-29 NOTE — Assessment & Plan Note (Signed)
Improved with wrist braces and prn meloxicam. Refill sent.

## 2016-07-29 NOTE — Assessment & Plan Note (Signed)
Plan follow up lipid panel in 1 month.

## 2016-07-29 NOTE — Patient Instructions (Addendum)
You may continue meloxicam as needed for carpal tunnel syndrome, as well as continuing the wrist braces.

## 2016-07-29 NOTE — Progress Notes (Signed)
Subjective:    Patient ID: Bailey Harris, female    DOB: Nov 17, 1961, 55 y.o.   MRN: [id number]  HPI  Bailey Harris is a 55 yr old female who presents today for follow up. She is accompanied by her husband who assists with translation today.   1) DM2- Reports that she has been doing well with diet.   Lab Results  Component Value Date   HGBA1C 6.7 (H) 05/02/2016   HGBA1C 6.6 (H) 12/18/2015   Lab Results  Component Value Date   MICROALBUR 0.7 05/02/2016   LDLCALC 123 (H) 05/02/2016   CREATININE 0.59 12/15/2015   2) Carpal tunnel- reports that she has been using wrist braces and completed the meloxicam. Reports that her symptoms are improve.   3) Vit D deficiency- continues weekly supplement. Due for follow up level in the end of September.     Review of Systems See HPI  No past medical history on file.   Social History   Social History  . Marital status: Married    Spouse name: N/A  . Number of children: N/A  . Years of education: N/A   Occupational History  . Not on file.   Social History Main Topics  . Smoking status: Never Smoker  . Smokeless tobacco: Not on file  . Alcohol use No  . Drug use: No  . Sexual activity: Not on file   Other Topics Concern  . 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 CESAREAN SECTION      Family History  Problem Relation Age of Onset  . Cancer Mother     died at 2 from [place] (pt unsure what type of cancer)  . Cancer Father     throat cancer  . CVA Father     died age 69  . CVA Sister     92- died  . CVA Brother     71 died    No Known Allergies  Current Outpatient Prescriptions on File Prior to Visit  Medication Sig Dispense Refill  .  aspirin EC 81 MG tablet Take 1 tablet (81 mg total) by mouth daily.    Marland Kitchen atorvastatin (LIPITOR) 20 MG tablet Take 1 tablet (20 mg total) by mouth daily. 30 tablet 3  . Calcium Carbonate (CALCIUM 600 PO) Take 1 tablet by mouth 2 (two) times daily.    . Vitamin D, Ergocalciferol, (DRISDOL) 50000 units CAPS capsule Take 1 capsule (50,000 Units total) by mouth every 7 (seven) days. 12 capsule 0   No current facility-administered medications on file prior to visit.     BP 118/76   Pulse 74   Temp 98 F (36.7 C) (Oral)   Resp 16   Ht 5' 3"  (1.6 m)   Wt 166 lb 9.6 oz (75.6 kg)   LMP 12/30/2010   SpO2 97% Comment: room air  BMI 29.51 kg/m       Objective:   Physical Exam  Constitutional: She is oriented to person, place, and time. She appears well-developed and well-nourished.  Cardiovascular: Normal rate, regular rhythm and normal heart sounds.  No murmur heard. Pulmonary/Chest: Effort normal and breath sounds normal. No respiratory distress. She has no wheezes.  Musculoskeletal: She exhibits no edema.  Neurological: She is alert and oriented to person, place, and time.  Skin: Skin is warm and dry. No rash noted.  Psychiatric: She has a normal mood and affect. Her behavior is normal. Judgment and thought content normal.          Assessment & Plan:

## 2016-07-29 NOTE — Progress Notes (Signed)
Pre visit review using our clinic review tool, if applicable. No additional management support is needed unless otherwise documented below in the visit note. 

## 2016-08-28 DIAGNOSIS — E119 Type 2 diabetes mellitus without complications: Secondary | ICD-10-CM | POA: Diagnosis not present

## 2016-08-28 LAB — HM DIABETES EYE EXAM

## 2016-08-29 ENCOUNTER — Other Ambulatory Visit: Payer: BLUE CROSS/BLUE SHIELD

## 2016-09-04 ENCOUNTER — Encounter: Payer: Self-pay | Admitting: Family

## 2016-09-04 NOTE — Progress Notes (Signed)
[id number]  

## 2016-09-26 ENCOUNTER — Other Ambulatory Visit: Payer: BLUE CROSS/BLUE SHIELD

## 2016-09-26 LAB — BASIC METABOLIC PANEL
BUN: 13 mg/dL (ref 6–23)
CHLORIDE: 108 meq/L (ref 96–112)
CO2: 24 meq/L (ref 19–32)
Calcium: 8.5 mg/dL (ref 8.4–10.5)
Creatinine, Ser: 0.68 mg/dL (ref 0.40–1.20)
GFR: 95.82 mL/min (ref >60.00)
GLUCOSE: 115 mg/dL — AB (ref 70–99)
POTASSIUM: 3.7 meq/L (ref 3.5–5.1)
Sodium: 140 mEq/L (ref 135–145)

## 2016-09-26 LAB — VITAMIN D 25 HYDROXY (VIT D DEFICIENCY, FRACTURES): VITD: 24.49 ng/mL — ABNORMAL LOW (ref 30.00–100.00)

## 2016-09-26 LAB — LIPID PANEL
CHOL/HDL RATIO: 5
Cholesterol: 195 mg/dL (ref 0–200)
HDL: 38.4 mg/dL — ABNORMAL LOW (ref >39.00)
LDL CALC: 132 mg/dL — AB (ref 0–99)
NONHDL: 156.53
Triglycerides: 125 mg/dL (ref 0.0–149.0)
VLDL: 25 mg/dL (ref 0.0–40.0)

## 2016-09-26 LAB — HEMOGLOBIN A1C: HEMOGLOBIN A1C: 6.6 % — AB (ref 4.6–6.5)

## 2016-09-29 ENCOUNTER — Telehealth: Payer: Self-pay | Admitting: Family

## 2016-09-29 MED ORDER — ATORVASTATIN CALCIUM 40 MG PO TABS: 40.0000 mg | ORAL_TABLET | Freq: Every day | ORAL | 3 refills | Status: DC

## 2016-09-29 MED ORDER — VITAMIN D (ERGOCALCIFEROL) 1.25 MG (50000 UNIT) PO CAPS: 50000.0000 [IU] | ORAL_CAPSULE | ORAL | 0 refills | Status: DC

## 2016-09-29 NOTE — Telephone Encounter (Signed)
Cholesterol is above goal. Advise pt to increase lipitor from 20mg  to 40mg  daily. Vit D still low, continue weekly vit D x 12 weeks.  Keep upcoming apt in November.  Sugar is at goal.

## 2016-09-30 NOTE — Telephone Encounter (Signed)
Patient has been notified of labs. She voices understanding, pt also has picked up rx. PC

## 2016-10-14 ENCOUNTER — Encounter: Payer: Self-pay | Admitting: Family

## 2016-10-14 ENCOUNTER — Ambulatory Visit (INDEPENDENT_AMBULATORY_CARE_PROVIDER_SITE_OTHER): Payer: BLUE CROSS/BLUE SHIELD | Admitting: Family

## 2016-10-14 VITALS — BP 113/67 | HR 72 | Temp 98.5°F | Resp 16 | Ht 63.0 in | Wt 170.0 lb

## 2016-10-14 DIAGNOSIS — R1032 Left lower quadrant pain: Secondary | ICD-10-CM | POA: Diagnosis not present

## 2016-10-14 DIAGNOSIS — K625 Hemorrhage of anus and rectum: Secondary | ICD-10-CM | POA: Diagnosis not present

## 2016-10-14 DIAGNOSIS — K146 Glossodynia: Secondary | ICD-10-CM | POA: Diagnosis not present

## 2016-10-14 DIAGNOSIS — Z23 Encounter for immunization: Secondary | ICD-10-CM

## 2016-10-14 LAB — POCT HEMOGLOBIN: Hemoglobin: 12.5 g/dL (ref 12.2–16.2)

## 2016-10-14 MED ORDER — HYDROCORTISONE ACE-PRAMOXINE 1-1 % RE FOAM
1.0000 | Freq: Two times a day (BID) | RECTAL | 0 refills | Status: DC
Start: 2016-10-14 — End: 2016-11-25

## 2016-10-14 NOTE — Progress Notes (Signed)
Pre visit review using our clinic review tool, if applicable. No additional management support is needed unless otherwise documented below in the visit note. 

## 2016-10-14 NOTE — Progress Notes (Signed)
Subjective:    Patient ID: Bailey Harris, female    DOB: 1961/01/21, 55 y.o.   MRN: [id number]  HPI  [name] is  55 yr old female who presents today with report of rectal bleeding and pain x 5 days. Reports that she blood has been present with wiping. No significant blood in the toilet. Used preparation H with brief improvement in the discomfort. She denies black stools.  She also notes easy bruising x 2 months.  Also reports some left sided abdominal tenderness x approximately 2 months.   Describes some burning on her tongue, as if she may have burned it on a cup of coffee.      Review of Systems See HPI  No past medical history on file.   Social History   Social History  . Marital status: Married    Spouse name: N/A  . Number of children: N/A  . Years of education: N/A   Occupational History  . Not on file.   Social History Main Topics  . Smoking status: Never Smoker  . Smokeless tobacco: Not on file  . Alcohol use No  . Drug use: No  . Sexual activity: Not on file   Other Topics Concern  . 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 CESAREAN SECTION      Family History  Problem Relation Age of Onset  . Cancer Mother     died at 41 from [place] (pt unsure what type of cancer)  . Cancer Father     throat cancer  . CVA Father     died age 89  . CVA Sister     60- died  . CVA Brother     37 died    No Known Allergies  Current Outpatient Prescriptions on File Prior to Visit  Medication Sig Dispense Refill  . aspirin EC 81 MG tablet Take 1 tablet (81 mg total) by mouth daily.    Marland Kitchen atorvastatin (LIPITOR) 40 MG tablet Take 1 tablet (40 mg total) by mouth daily. 90 tablet 3  . Calcium  Carbonate (CALCIUM 600 PO) Take 1 tablet by mouth 2 (two) times daily.    . Vitamin D, Ergocalciferol, (DRISDOL) 50000 units CAPS capsule Take 1 capsule (50,000 Units total) by mouth every 7 (seven) days. 12 capsule 0   No current facility-administered medications on file prior to visit.     BP 113/67 (BP Location: Right Arm, Cuff Size: Large)   Pulse 72   Temp 98.5 F (36.9 C) (Oral)   Resp 16   Ht 5' 3"  (1.6 m)   Wt 170 lb (77.1 kg)   LMP 12/30/2010   SpO2 99% Comment: room air  BMI 30.11 kg/m       Objective:   Physical Exam  Constitutional: She appears well-developed and well-nourished.  Cardiovascular: Normal rate, regular rhythm and normal heart sounds.   No murmur heard. Pulmonary/Chest: Effort normal and breath sounds normal. No respiratory distress. She has no wheezes.  Abdominal: Soft. Bowel sounds are normal. She exhibits no distension. There is tenderness in the left  lower quadrant. There is no guarding.  Genitourinary:  Genitourinary Comments: Few small external hemorrhoids noted.   Psychiatric: She has a normal mood and affect. Her behavior is normal. Judgment and thought content normal.          Assessment & Plan:  Hemorrhoidal bleeding- Hemocue performed in office since lab was closed at time of visit. Hgb stable today at 12.5. Advised pt to begin procotofoam bid prn.  Also advised patient to go to the ER if you develop severe/worsening abdominal pain, or if you develop severe rectal bleeding (toilet water is red- not just streaks on tissue and stool). Eat lots of fresh fruits/veggies, and consider adding a fiber supplement once daily such as metamucil. Will also place referral to GI.  LLQ pain- has been present x 2 months. Will obtain baseline Cr and send for CT abd/pelvis.   Tongue pain- normal on exam, check b12/folate level.

## 2016-10-14 NOTE — Patient Instructions (Addendum)
Please schedule lab visit at the front desk.  Go to the ER if you develop severe/worsening abdominal pain, or if you develop severe rectal bleeding (toilet water is red- not just streaks on tissue and stool). Eat lots of fresh fruits/veggies, and consider adding a fiber supplement once daily such as metamucil. For hemorrhoids, you may use proctofoam twice daily as needed.

## 2016-10-15 ENCOUNTER — Other Ambulatory Visit (INDEPENDENT_AMBULATORY_CARE_PROVIDER_SITE_OTHER): Payer: BLUE CROSS/BLUE SHIELD

## 2016-10-15 DIAGNOSIS — R1032 Left lower quadrant pain: Secondary | ICD-10-CM | POA: Diagnosis not present

## 2016-10-16 ENCOUNTER — Encounter: Payer: Self-pay | Admitting: Family

## 2016-10-16 ENCOUNTER — Ambulatory Visit (HOSPITAL_BASED_OUTPATIENT_CLINIC_OR_DEPARTMENT_OTHER)
Admission: RE | Admit: 2016-10-16 | Discharge: 2016-10-16 | Disposition: A | Discharge status: Home / Self Care | Payer: BLUE CROSS/BLUE SHIELD | Source: Ambulatory Visit | Attending: Family | Admitting: Family

## 2016-10-16 DIAGNOSIS — R1032 Left lower quadrant pain: Secondary | ICD-10-CM | POA: Insufficient documentation

## 2016-10-16 DIAGNOSIS — N200 Calculus of kidney: Secondary | ICD-10-CM | POA: Diagnosis not present

## 2016-10-16 DIAGNOSIS — Z23 Encounter for immunization: Secondary | ICD-10-CM

## 2016-10-16 DIAGNOSIS — K760 Fatty (change of) liver, not elsewhere classified: Secondary | ICD-10-CM | POA: Insufficient documentation

## 2016-10-16 LAB — CBC WITH DIFFERENTIAL/PLATELET
Basophils Absolute: 0 10*3/uL (ref 0.0–0.1)
Basophils Relative: 0.8 % (ref 0.0–3.0)
EOS ABS: 0.1 10*3/uL (ref 0.0–0.7)
EOS PCT: 1.5 % (ref 0.0–5.0)
HCT: 37.2 % (ref 36.0–46.0)
HEMOGLOBIN: 12.8 g/dL (ref 12.0–15.0)
Lymphocytes Relative: 26.8 % (ref 12.0–46.0)
Lymphs Abs: 1.2 10*3/uL (ref 0.7–4.0)
MCHC: 34.4 g/dL (ref 30.0–36.0)
MCV: 91.9 fl (ref 78.0–100.0)
MONO ABS: 0.5 10*3/uL (ref 0.1–1.0)
Monocytes Relative: 10.8 % (ref 3.0–12.0)
Neutro Abs: 2.8 10*3/uL (ref 1.4–7.7)
Neutrophils Relative %: 60.1 % (ref 43.0–77.0)
Platelets: 232 10*3/uL (ref 150.0–400.0)
RBC: 4.05 Mil/uL (ref 3.87–5.11)
RDW: 12.4 % (ref 11.5–15.5)
WBC: 4.6 10*3/uL (ref 4.0–10.5)

## 2016-10-16 LAB — BASIC METABOLIC PANEL
BUN: 12 mg/dL (ref 6–23)
CALCIUM: 10.3 mg/dL (ref 8.4–10.5)
CO2: 29 mEq/L (ref 19–32)
CREATININE: 0.6 mg/dL (ref 0.40–1.20)
Chloride: 103 mEq/L (ref 96–112)
GFR: 110.68 mL/min (ref >60.00)
Glucose, Bld: 111 mg/dL — ABNORMAL HIGH (ref 70–99)
Potassium: 4.2 mEq/L (ref 3.5–5.1)
Sodium: 139 mEq/L (ref 135–145)

## 2016-10-16 LAB — FOLATE: FOLATE: 15.6 ng/mL (ref >5.9)

## 2016-10-16 LAB — VITAMIN B12: Vitamin B-12: 520 pg/mL (ref 211–911)

## 2016-10-16 MED ORDER — IOPAMIDOL (ISOVUE-300) INJECTION 61%
100.0000 mL | Freq: Once | INTRAVENOUS | Status: AC | PRN
  Administered 2016-10-16: 100 mL via INTRAVENOUS

## 2016-10-19 ENCOUNTER — Telehealth: Payer: Self-pay | Admitting: Family

## 2016-10-19 NOTE — Telephone Encounter (Signed)
Reviewed CT- it shows fatty liver (need to work on low fat/low cholesterol diet/exercise/weight loss).  There is a slightly enlarged lymph node.  Radiologist recommends that we repeat CT scan in 3-6 months to make sure that the lymph node is stable in size.  Note of constipation is made.  For constipation- I would recommend 1 cap of miralax in 8 oz of water or juice.  Increase fresh fruits and veggies and increase water.

## 2016-10-22 NOTE — Telephone Encounter (Signed)
Left message for pt's spouse to return my call. 

## 2016-10-22 NOTE — Telephone Encounter (Signed)
Notified pt's spouse and he voices understanding. 

## 2016-11-25 ENCOUNTER — Ambulatory Visit (INDEPENDENT_AMBULATORY_CARE_PROVIDER_SITE_OTHER): Payer: BLUE CROSS/BLUE SHIELD | Admitting: Family

## 2016-11-25 ENCOUNTER — Encounter: Payer: Self-pay | Admitting: Family

## 2016-11-25 ENCOUNTER — Ambulatory Visit: Payer: BLUE CROSS/BLUE SHIELD | Admitting: Family

## 2016-11-25 VITALS — BP 116/57 | HR 70 | Temp 98.4°F | Resp 16 | Ht 63.0 in | Wt 164.0 lb

## 2016-11-25 DIAGNOSIS — E785 Hyperlipidemia, unspecified: Secondary | ICD-10-CM

## 2016-11-25 DIAGNOSIS — G5601 Carpal tunnel syndrome, right upper limb: Secondary | ICD-10-CM

## 2016-11-25 DIAGNOSIS — E119 Type 2 diabetes mellitus without complications: Secondary | ICD-10-CM

## 2016-11-25 DIAGNOSIS — E559 Vitamin D deficiency, unspecified: Secondary | ICD-10-CM

## 2016-11-25 MED ORDER — MELOXICAM 7.5 MG PO TABS: 7.5000 mg | ORAL_TABLET | Freq: Every day | ORAL | 0 refills | Status: DC | PRN

## 2016-11-25 NOTE — Assessment & Plan Note (Signed)
Tolerating increased dose of statin. Will plan to repeat with labs in early January.

## 2016-11-25 NOTE — Assessment & Plan Note (Signed)
Stable, requests refill of prn meloxicam.

## 2016-11-25 NOTE — Progress Notes (Signed)
Subjective:    Patient ID: Bailey Harris, female    DOB: 06/29/1961, 55 y.o.   MRN: [id number]  HPI  Ms. Bailey Harris is a 55 yr old female who presents today for follow up.  1) DM2- diet controlled. She does not check at home.   Lab Results  Component Value Date   HGBA1C 6.6 (H) 09/26/2016   HGBA1C 6.7 (H) 05/02/2016   HGBA1C 6.6 (H) 12/18/2015   Lab Results  Component Value Date   MICROALBUR 0.7 05/02/2016   LDLCALC 132 (H) 09/26/2016   CREATININE 0.60 10/15/2016   2) Hyperlipidemia- on lipitor 56m.  Lab Results  Component Value Date   CHOL 195 09/26/2016   HDL 38.40 (L) 09/26/2016   LDLCALC 132 (H) 09/26/2016   TRIG 125.0 09/26/2016   CHOLHDL 5 09/26/2016   3) Carpal Tunnel- has been treated with wrist braces and meloxicam in the past with good relief of symptoms. She reports that her symptoms remain stable and only rarely needing braces.    Review of Systems See HPI  No past medical history on file.   Social History   Social History  . Marital status: Married    Spouse name: N/A  . Number of children: N/A  . Years of education: N/A   Occupational History  . Not on file.   Social History Main Topics  . Smoking status: Never Smoker  . Smokeless tobacco: Not on file  . Alcohol use No  . Drug use: No  . Sexual activity: Not on file   Other Topics Concern  . Not on file   Social History Narrative   Married   3 children daughter- [place]  [place] in [place]  Completed [place] education[place]            Past Surgical History:  Procedure Laterality Date  . CESAREAN SECTION      Family History  Problem Relation Age of Onset  . Cancer Mother     died at 872from [place](pt unsure what type of cancer)  . Cancer Father     throat cancer  . CVA Father     died age 644 . CVA Sister     460 died  . CVA Brother     544died    No Known  Allergies  Current Outpatient Prescriptions on File Prior to Visit  Medication Sig Dispense Refill  . aspirin EC 81 MG tablet Take 1 tablet (81 mg total) by mouth daily.    .Marland Kitchenatorvastatin (LIPITOR) 40 MG tablet Take 1 tablet (40 mg total) by mouth daily. 90 tablet 3  . Calcium Carbonate (CALCIUM 600 PO) Take 1 tablet by mouth 2 (two) times daily.    . Vitamin D, Ergocalciferol, (DRISDOL) 50000 units CAPS capsule Take 1 capsule (50,000 Units total) by mouth every 7 (seven) days. 12 capsule 0   No current facility-administered medications on file prior to visit.     BP (!) 116/57 (BP Location: Right Arm, Cuff Size: Normal)   Pulse 70   Temp 98.4 F (36.9 C) (Oral)   Resp 16   Ht 5' 3"  (1.6 m)   Wt 164 lb (74.4 kg)   LMP 12/30/2010   SpO2 99% Comment: room air  BMI 29.05 kg/m  Objective:   Physical Exam  Constitutional: She is oriented to person, place, and time. She appears well-developed and well-nourished.  HENT:  Head: Normocephalic and atraumatic.  Cardiovascular: Normal rate, regular rhythm and normal heart sounds.   No murmur heard. Pulmonary/Chest: Effort normal and breath sounds normal. No respiratory distress. She has no wheezes.  Neurological: She is alert and oriented to person, place, and time.  Psychiatric: She has a normal mood and affect. Her behavior is normal. Judgment and thought content normal.          Assessment & Plan:

## 2016-11-25 NOTE — Assessment & Plan Note (Signed)
Clinically stable. Will return for A1C in early January.

## 2016-11-25 NOTE — Progress Notes (Signed)
Pre visit review using our clinic review tool, if applicable. No additional management support is needed unless otherwise documented below in the visit note. 

## 2016-11-25 NOTE — Patient Instructions (Addendum)
Please call [facility] to schedule an appointment. [phone]

## 2016-12-18 ENCOUNTER — Other Ambulatory Visit: Payer: Self-pay | Admitting: Family

## 2017-01-09 ENCOUNTER — Other Ambulatory Visit (INDEPENDENT_AMBULATORY_CARE_PROVIDER_SITE_OTHER): Payer: BLUE CROSS/BLUE SHIELD

## 2017-01-09 DIAGNOSIS — E559 Vitamin D deficiency, unspecified: Secondary | ICD-10-CM | POA: Diagnosis not present

## 2017-01-09 DIAGNOSIS — E785 Hyperlipidemia, unspecified: Secondary | ICD-10-CM | POA: Diagnosis not present

## 2017-01-09 DIAGNOSIS — E119 Type 2 diabetes mellitus without complications: Secondary | ICD-10-CM

## 2017-01-09 LAB — HEMOGLOBIN A1C: HEMOGLOBIN A1C: 6.5 % (ref 4.6–6.5)

## 2017-01-09 LAB — LIPID PANEL
CHOL/HDL RATIO: 3
Cholesterol: 116 mg/dL (ref 0–200)
HDL: 38.2 mg/dL — AB (ref >39.00)
LDL CALC: 62 mg/dL (ref 0–99)
NONHDL: 77.95
Triglycerides: 79 mg/dL (ref 0.0–149.0)
VLDL: 15.8 mg/dL (ref 0.0–40.0)

## 2017-01-09 LAB — VITAMIN D 25 HYDROXY (VIT D DEFICIENCY, FRACTURES): VITD: 32.87 ng/mL (ref 30.00–100.00)

## 2017-01-12 ENCOUNTER — Telehealth: Payer: Self-pay | Admitting: Family

## 2017-01-12 MED ORDER — VITAMIN D3 75 MCG (3000 UT) PO TABS: 1.0000 | ORAL_TABLET | Freq: Every day | ORAL | Status: DC

## 2017-01-12 NOTE — Telephone Encounter (Signed)
Follow up vit D level looks good. D/c weekly vit D supplement and begin 3000iu daily of vitamin D available OTC.  Cholesterol is improved.  Sugar is stable. Continue lipitor and low cholesterol diet.

## 2017-01-13 NOTE — Telephone Encounter (Signed)
Notified pt's spouse and he voices understanding. 

## 2017-04-21 ENCOUNTER — Ambulatory Visit: Payer: BLUE CROSS/BLUE SHIELD | Admitting: Family

## 2017-04-28 ENCOUNTER — Ambulatory Visit: Payer: BLUE CROSS/BLUE SHIELD | Admitting: Family

## 2017-04-28 ENCOUNTER — Telehealth: Payer: Self-pay | Admitting: Family

## 2017-04-28 NOTE — Telephone Encounter (Signed)
Spouse cancelled follow up for 4:45pm today and scheduled physical for June, charge or no charge

## 2017-04-28 NOTE — Telephone Encounter (Signed)
No charge. 

## 2017-06-02 ENCOUNTER — Encounter: Payer: BLUE CROSS/BLUE SHIELD | Admitting: Family

## 2017-07-16 ENCOUNTER — Telehealth: Payer: Self-pay | Admitting: Medical

## 2017-07-16 ENCOUNTER — Telehealth: Payer: Self-pay | Admitting: Family

## 2017-07-16 ENCOUNTER — Other Ambulatory Visit: Payer: Self-pay | Admitting: Family

## 2017-07-16 DIAGNOSIS — Z1231 Encounter for screening mammogram for malignant neoplasm of breast: Secondary | ICD-10-CM

## 2017-07-16 NOTE — Telephone Encounter (Signed)
Error. Will send to provider Mackie Pai.

## 2017-07-16 NOTE — Telephone Encounter (Signed)
Ok with me 

## 2017-07-16 NOTE — Telephone Encounter (Signed)
Pt's husband called in to request to switch providers on behalf of his spouse. He states that due to pt only speaking spanish sometimes she has difficulty understanding current PCP. He feels that a spanish speaking provider would be better for her.    Is this switch okay?   CB: [phone]- Mr. Behan - I will let pt's spouse know if switch is okay.

## 2017-07-16 NOTE — Telephone Encounter (Signed)
If Melissa approves switch then will accept.

## 2017-07-18 ENCOUNTER — Telehealth: Payer: Self-pay | Admitting: Medical

## 2017-07-18 NOTE — Telephone Encounter (Signed)
Okay I will call pt to make her aware. Percell Miller would you like a 30 minute visit scheduled with pt to transition care?

## 2017-07-18 NOTE — Telephone Encounter (Signed)
30 minute would be good. Early am or pm.8-9 am  or 1-2 pm  thanks

## 2017-07-18 NOTE — Telephone Encounter (Signed)
Okay, thanks !  Pt has been scheduled.

## 2017-07-22 ENCOUNTER — Encounter: Payer: Self-pay | Admitting: Internal Medicine

## 2017-07-22 ENCOUNTER — Encounter: Payer: Self-pay | Admitting: Medical

## 2017-07-22 ENCOUNTER — Ambulatory Visit (INDEPENDENT_AMBULATORY_CARE_PROVIDER_SITE_OTHER): Payer: BLUE CROSS/BLUE SHIELD | Admitting: Medical

## 2017-07-22 VITALS — BP 117/72 | HR 57 | Temp 98.3°F | Resp 16 | Ht 63.0 in | Wt 171.6 lb

## 2017-07-22 DIAGNOSIS — R42 Dizziness and giddiness: Secondary | ICD-10-CM

## 2017-07-22 DIAGNOSIS — E119 Type 2 diabetes mellitus without complications: Secondary | ICD-10-CM

## 2017-07-22 DIAGNOSIS — K635 Polyp of colon: Secondary | ICD-10-CM | POA: Diagnosis not present

## 2017-07-22 DIAGNOSIS — E785 Hyperlipidemia, unspecified: Secondary | ICD-10-CM

## 2017-07-22 DIAGNOSIS — R109 Unspecified abdominal pain: Secondary | ICD-10-CM

## 2017-07-22 LAB — CBC WITH DIFFERENTIAL/PLATELET
BASOS PCT: 0.8 % (ref 0.0–3.0)
Basophils Absolute: 0 10*3/uL (ref 0.0–0.1)
EOS PCT: 1.6 % (ref 0.0–5.0)
Eosinophils Absolute: 0.1 10*3/uL (ref 0.0–0.7)
HCT: 40.6 % (ref 36.0–46.0)
Hemoglobin: 13.8 g/dL (ref 12.0–15.0)
LYMPHS ABS: 1.5 10*3/uL (ref 0.7–4.0)
Lymphocytes Relative: 32.8 % (ref 12.0–46.0)
MCHC: 33.9 g/dL (ref 30.0–36.0)
MCV: 94 fl (ref 78.0–100.0)
MONO ABS: 0.5 10*3/uL (ref 0.1–1.0)
MONOS PCT: 9.8 % (ref 3.0–12.0)
NEUTROS ABS: 2.5 10*3/uL (ref 1.4–7.7)
NEUTROS PCT: 55 % (ref 43.0–77.0)
Platelets: 279 10*3/uL (ref 150.0–400.0)
RBC: 4.32 Mil/uL (ref 3.87–5.11)
RDW: 12.3 % (ref 11.5–15.5)
WBC: 4.6 10*3/uL (ref 4.0–10.5)

## 2017-07-22 LAB — COMPLETE METABOLIC PANEL WITH GFR
ALK PHOS: 77 U/L (ref 33–130)
ALT: 38 U/L — ABNORMAL HIGH (ref 6–29)
AST: 26 U/L (ref 10–35)
Albumin: 4.4 g/dL (ref 3.6–5.1)
BILIRUBIN TOTAL: 0.9 mg/dL (ref 0.2–1.2)
BUN: 13 mg/dL (ref 7–25)
CO2: 22 mmol/L (ref 20–31)
Calcium: 9.4 mg/dL (ref 8.6–10.4)
Chloride: 103 mmol/L (ref 98–110)
Creat: 0.7 mg/dL (ref 0.50–1.05)
Glucose, Bld: 127 mg/dL — ABNORMAL HIGH (ref 65–99)
POTASSIUM: 4.9 mmol/L (ref 3.5–5.3)
Sodium: 139 mmol/L (ref 135–146)
TOTAL PROTEIN: 7.4 g/dL (ref 6.1–8.1)

## 2017-07-22 LAB — AMYLASE: Amylase: 48 U/L (ref 27–131)

## 2017-07-22 LAB — LIPASE: Lipase: 91 U/L — ABNORMAL HIGH (ref 11.0–59.0)

## 2017-07-22 LAB — HEMOGLOBIN A1C: Hgb A1c MFr Bld: 7.3 % — ABNORMAL HIGH (ref 4.6–6.5)

## 2017-07-22 MED ORDER — METFORMIN HCL 500 MG PO TABS: 500.0000 mg | ORAL_TABLET | Freq: Two times a day (BID) | ORAL | 3 refills | Status: DC

## 2017-07-22 MED ORDER — RANITIDINE HCL 150 MG PO CAPS: 150.0000 mg | ORAL_CAPSULE | Freq: Two times a day (BID) | ORAL | 0 refills | Status: DC

## 2017-07-22 NOTE — Progress Notes (Signed)
Subjective:    Patient ID: Bailey Harris, female    DOB: December 17, 1961, 56 y.o.   MRN: [id number]  HPI  Pt in for follow up.   Pt state in with hx of leg pain int thighs which  she attributed to  lipitor. Pain was more in legs when she first woke in the morning. She stopped lipitor and the pain went away. Has not returned.  Pt in past was on cholesterol med for 9 months only.   Pt states parents had no stroke or MI. One brother and one sister with stroke. Brother had htn uncontrolled.  Pt not smoker.   Pt in for some epigastic pain for 2 years. Pain sometimes after night eating well. No medication given for stomach pain.   Pt also states some left lower quadrant pain on and off. Sometimes with particular foods. No diarrhea. No known hx of diverticuli. No current pain today.  Pt states 6 years ago had colonsocopy in [place]. Found polyp. Pt does not have report.   Pt had not sure if mom had cancer of stomach or colon.   6 years ago pt had h pylori. Was treated.  Pt states also very transient mid dizziness changing postitions But no associate neuruologic deficits.When has last 5-10 seconds. Last time was yesterday. None today/current.    Review of Systems  Constitutional: Negative for chills, fatigue and fever.  Respiratory: Negative for cough, chest tightness, shortness of breath and wheezing.   Cardiovascular: Negative for chest pain and palpitations.  Gastrointestinal: Positive for abdominal pain. Negative for abdominal distention, blood in stool, constipation, diarrhea, nausea, rectal pain and vomiting.       See hpi,  Musculoskeletal: Positive for myalgias.       Off thighs resolved. See hpi.  Neurological: Positive for dizziness. Negative for speech difficulty, weakness, numbness and headaches.       See hpi. None currently.  Psychiatric/Behavioral: Negative for behavioral problems, confusion and sleep disturbance. The patient is not nervous/anxious.     No past  medical history on file.   Social History   Social History  . Marital status: Married    Spouse name: N/A  . Number of children: N/A  . Years of education: N/A   Occupational History  . Not on file.   Social History Main Topics  . Smoking status: Never Smoker  . Smokeless tobacco: Never Used  . Alcohol use No  . Drug use: No  . Sexual activity: Not on file   Other Topics Concern  . 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 CESAREAN SECTION      Family History  Problem Relation Age of Onset  . Cancer Mother        died at 18 from [place] (pt unsure what type of cancer)  . Cancer Father        throat cancer  . CVA Father        died age 11  . CVA Sister        24- died  . CVA Brother  50 died    No Known Allergies  Current Outpatient Prescriptions on File Prior to Visit  Medication Sig Dispense Refill  . aspirin EC 81 MG tablet Take 1 tablet (81 mg total) by mouth daily.    Marland Kitchen atorvastatin (LIPITOR) 40 MG tablet Take 1 tablet (40 mg total) by mouth daily. 90 tablet 3  . Calcium Carbonate (CALCIUM 600 PO) Take 1 tablet by mouth 2 (two) times daily.    . Cholecalciferol (VITAMIN D3) 3000 units TABS Take 1 tablet by mouth daily. 30 tablet   . meloxicam (MOBIC) 7.5 MG tablet Take 1 tablet (7.5 mg total) by mouth daily as needed for pain. 14 tablet 0   No current facility-administered medications on file prior to visit.     BP 117/72   Pulse (!) 57   Temp 98.3 F (36.8 C) (Oral)   Resp 16   Ht 5' 3"  (1.6 m)   Wt 171 lb 9.6 oz (77.8 kg)   LMP 12/30/2010   SpO2 100%   BMI 30.40 kg/m       Objective:   Physical Exam  General Mental Status- Alert. General Appearance- Not in  acute distress.   Skin General: Color- Normal Color. Moisture- Normal Moisture.  Neck Carotid Arteries- Normal color. Moisture- Normal Moisture. No carotid bruits. No JVD.  Chest and Lung Exam Auscultation: Breath Sounds:-Normal.  Cardiovascular Auscultation:Rythm- Regular. Murmurs & Other Heart Sounds:Auscultation of the heart reveals- No Murmurs.  Abdomen Inspection:-Inspeection Normal. Palpation/Percussion:Note:No mass. Palpation and Percussion of the abdomen reveal- faint epigastric Tender, Non Distended + BS, no rebound or guarding.(points to left lower quadrant as source of chronic intermitent pain.   Neurologic Cranial Nerve exam:- CN III-XII intact(No nystagmus), symmetric smile. Romberg Exam:- Negative.  Finger to Nose:- Normal/Intact Strength:- 5/5 equal and symmetric strength both upper and lower extremities.      Assessment & Plan:  For abdomen pain will get labs today and include h pylori test.  For llq pain and polyp hx will refer to GI. Get old records and sign release form.  Positional appears positional and benign but if severe constant or associated symptoms be seen here or ED. Get balance before ambulating.   For diabetes strict diet and exercise. Metformin rx sent.  For high cholesterol continue fish oil. Unfortunate side effect from lipitor so stop. Repeat lipid in 3 months after strict diet.  Follow up in 3 months or as needed  Educated on signs and symptoms of diverticulitis which she does not have now.  Devone Tousley, Percell Miller, PA-C

## 2017-07-22 NOTE — Patient Instructions (Signed)
For abdomen pain will get labs today and include h pylori test.  For llq pain and polyp hx will refer to GI. Get old records and sign release form.  Positional appears positional and benign but if severe constant or associated symptoms be seen here or ED. Get balance before ambulating.   For diabetes strict diet and exercise. Metformin rx sent.  For high cholesterol continue fish oil. Unfortunate side effect from lipitor so stop. Repeat lipid in 3 months after strict diet.  Follow up in 3 months or as needed

## 2017-07-23 ENCOUNTER — Telehealth: Payer: Self-pay | Admitting: Medical

## 2017-07-23 LAB — H. PYLORI BREATH TEST: H. pylori Breath Test: NOT DETECTED

## 2017-07-23 NOTE — Telephone Encounter (Signed)
Pt's spouse also was informed informed about her pancrease enzyme results.

## 2017-07-23 NOTE — Telephone Encounter (Signed)
-----   Message from Mackie Pai, PA-C sent at 07/23/2017 12:54 PM EDT ----- Bacteria test negative. Good kidney function. No significant liver enzyme elevation. How is pt dong with medication I gave her.

## 2017-07-23 NOTE — Telephone Encounter (Signed)
Spoke with pt's spouse (Heliodoro- Spouse on DPR) and informed him about the results since pt was not available at the moment, spouse understood and stated that pt is doing better with meds that was given to her.

## 2017-07-23 NOTE — Telephone Encounter (Signed)
-----   Message from Mackie Pai, PA-C sent at 07/22/2017  9:21 PM EDT ----- (spanish speaker)Pt pancrease enzyme mild elevation. One pancrease enzyme normal and other protein study(lipase) minimal elevated.  I don't think she drinks alcohol but make sure avoid alcohol as that can cause pancrease to get inflamed. If  any increase in mild abdomen pain need to repeat pancrease enzymes. How is abdomen pain with use of ranitidine. 3 month blood sugar average is about 160. Her sugar has gone up from last labs. So do recommend start metformin. She has no anemia and normal infection fighting cells. Kidney function test pending and h pylori/bacerteria test pending.

## 2017-07-29 ENCOUNTER — Encounter (HOSPITAL_BASED_OUTPATIENT_CLINIC_OR_DEPARTMENT_OTHER): Payer: Self-pay

## 2017-07-29 ENCOUNTER — Ambulatory Visit (HOSPITAL_BASED_OUTPATIENT_CLINIC_OR_DEPARTMENT_OTHER)
Admission: RE | Admit: 2017-07-29 | Discharge: 2017-07-29 | Disposition: A | Discharge status: Home / Self Care | Payer: BLUE CROSS/BLUE SHIELD | Source: Ambulatory Visit | Attending: Family | Admitting: Family

## 2017-07-29 DIAGNOSIS — Z1231 Encounter for screening mammogram for malignant neoplasm of breast: Secondary | ICD-10-CM | POA: Insufficient documentation

## 2017-08-11 NOTE — Telephone Encounter (Signed)
error 

## 2017-08-26 ENCOUNTER — Other Ambulatory Visit: Payer: Self-pay

## 2017-08-26 MED ORDER — RANITIDINE HCL 150 MG PO CAPS: 150.0000 mg | ORAL_CAPSULE | Freq: Two times a day (BID) | ORAL | 0 refills | Status: DC

## 2017-08-26 NOTE — Telephone Encounter (Signed)
Faxed Ranitidine 30d till OV/thx dmf

## 2017-09-11 ENCOUNTER — Encounter (INDEPENDENT_AMBULATORY_CARE_PROVIDER_SITE_OTHER): Payer: Self-pay

## 2017-09-11 ENCOUNTER — Ambulatory Visit (INDEPENDENT_AMBULATORY_CARE_PROVIDER_SITE_OTHER): Payer: BLUE CROSS/BLUE SHIELD | Admitting: Internal Medicine

## 2017-09-11 ENCOUNTER — Encounter: Payer: Self-pay | Admitting: Internal Medicine

## 2017-09-11 VITALS — BP 120/78 | HR 68 | Ht 62.5 in | Wt 169.5 lb

## 2017-09-11 DIAGNOSIS — R935 Abnormal findings on diagnostic imaging of other abdominal regions, including retroperitoneum: Secondary | ICD-10-CM

## 2017-09-11 DIAGNOSIS — R1013 Epigastric pain: Secondary | ICD-10-CM

## 2017-09-11 DIAGNOSIS — Z860100 Personal history of colon polyps, unspecified: Secondary | ICD-10-CM

## 2017-09-11 DIAGNOSIS — K760 Fatty (change of) liver, not elsewhere classified: Secondary | ICD-10-CM

## 2017-09-11 DIAGNOSIS — K594 Anal spasm: Secondary | ICD-10-CM | POA: Diagnosis not present

## 2017-09-11 DIAGNOSIS — K5900 Constipation, unspecified: Secondary | ICD-10-CM

## 2017-09-11 DIAGNOSIS — K219 Gastro-esophageal reflux disease without esophagitis: Secondary | ICD-10-CM | POA: Diagnosis not present

## 2017-09-11 MED ORDER — OMEPRAZOLE 40 MG PO CPDR: 40.0000 mg | DELAYED_RELEASE_CAPSULE | Freq: Every day | ORAL | 6 refills | Status: DC

## 2017-09-11 MED ORDER — NA SULFATE-K SULFATE-MG SULF 17.5-3.13-1.6 GM/177ML PO SOLN: 1.0000 | Freq: Once | ORAL | 0 refills | Status: AC

## 2017-09-11 NOTE — Patient Instructions (Addendum)
We have sent the following medications to your pharmacy for you to pick up at your convenience:  Omeprazole  Use Miralax daily - (one capful mixed in water or juice)   You have been scheduled for an endoscopy and colonoscopy. Please follow the written instructions given to you at your visit today. Please pick up your prep supplies at the pharmacy within the next 1-3 days. If you use inhalers (even only as needed), please bring them with you on the day of your procedure. Your physician has requested that you go to www.startemmi.com and enter the access code given to you at your visit today. This web site gives a general overview about your procedure. However, you should still follow specific instructions given to you by our office regarding your preparation for the procedure.

## 2017-09-11 NOTE — Progress Notes (Signed)
HISTORY OF PRESENT ILLNESS:  Bailey Harris is a 56 y.o. female , native of [place], with past medical history as listed below who is sent today by her primary care provider Mackie Pai, PA-C with a myriad of GI complaints including epigastric pain, active reflux symptoms, chronic constipation, rectal pain, and a history of prior endoscopic procedures. The patient does not speak English and is accompanied by a Advertising account planner. 63 pages of outside records from [place] have been provided and reviewed today. Highlights to be summarized below. First, the patient tells me that she has had intermittent chronic epigastric pain for years. Associated with this has been pyrosis and regurgitation. She did report that on one occasion when treated for Helicobacter pylori her symptoms improved. She also has chronic constipation for which she takes fiber. Moves her bowels once every 2-3 days. Abdominal bloating occurs with constipation. This is relieved with defecation. Finally, she reports fleeting sharp rectal pain which occurs exclusively at night and lasts a few seconds to minutes. This may occur once or twice per month. This has been going on for some time without increased frequency or severity. Patient denies vomiting, melena, hematochezia, weight loss, or fevers. Current medications include Zantac 150 mg twice daily and meloxicam as needed. She is also on aspirin. Highlights from outside records include colonoscopy in September 2011 which revealed a tubular adenoma. Upper endoscopy in March 2012 which revealed mild gastroduodenitis. Biopsies were negative for Helicobacter pylori. CT scan October 2017 weight left lower quadrant pain with no acute abnormalities. She was noted to have fatty liver and nonobstructive nephrolithiasis as well as moderate stool in the colon. The patient's believes that she had both repeat colonoscopy and upper endoscopy in [place] in 2014. No records. She believes she was  told that she should have repeat examinations every 2 years. Not sure why... Review of blood work from July 2018 signs mild elevation of ALT 38. Otherwise normal liver tests. Mild elevation of lipase at 91. Normal CBC with differential. Hemoglobin A1c 7.3. 3 pound weight gain over the past 13 months. She does not smoke or use alcohol  REVIEW OF SYSTEMS:  All non-GI ROS negative unless otherwise stated in the history of present illness except for back pain, muscle cramps, ankle swelling, urinary frequency  Past Medical History:  Diagnosis Date  . Fatty liver   . GERD (gastroesophageal reflux disease)   . HLD (hyperlipidemia)   . Pancreatic insufficiency    insulin irregularity  . Vitamin D deficiency     Past Surgical History:  Procedure Laterality Date  . CESAREAN SECTION    . TUBAL LIGATION      Social History Bailey Harris  reports that she has never smoked. She has never used smokeless tobacco. She reports that she does not drink alcohol or use drugs.  family history includes CVA in her brother, father, and sister; Stomach cancer in her mother.  No Known Allergies     PHYSICAL EXAMINATION: Vital signs: BP 120/78 (BP Location: Left Arm, Patient Position: Sitting, Cuff Size: Normal)   Pulse 68   Ht 5' 2.5" (1.588 m) Comment: height measured without shoes  Wt 169 lb 8 oz (76.9 kg)   LMP 12/30/2010   BMI 30.51 kg/m   Constitutional: Pleasant, obese, without thyromegaly Lymph: generally well-appearing, no acute distress Psychiatric: alert and oriented x3, cooperative Eyes: extraocular movements intact, anicteric, conjunctiva pink Mouth: oral pharynx moist, no lesions Neck: supple no lymphadenopathy Cardiovascular: heart regular rate and rhythm,  no murmur Lungs: clear to auscultation bilaterally Abdomen: soft, obese, nontender, nondistended, no obvious ascites, no peritoneal signs, normal bowel sounds, no organomegaly Rectal: Omitted clubbing cyanosis or Extremities: no  lower extremity edema bilaterally Skin: no lesions on visible extremities Neuro: No focal deficits. Normal DTRs. No asterixis.  ASSESSMENT:  #1. GERD. Active symptoms on ranitidine #2. Epigastric pain. Rule out GERD. Rule out NSAID-induced ulcer #3. Chronic constipation. Functional #4. History of adenomatous colon polyp 2011. Possible follow-up colonoscopy 2014-results unknown #5. Rectal pain. Consistent with proctalgia fugax #5. Fatty liver on imaging and mild elevation of ALT secondary to the same   PLAN:  #1. Reflux precautions #2. Prescribe omeprazole 40 mg daily #3. Initiate MiraLAX 1 dose daily for constipation. Adjust if needed to achieve desired effect #4. Schedule colonoscopy for adenomatous polyp surveillance.The nature of the procedure, as well as the risks, benefits, and alternatives were carefully and thoroughly reviewed with the patient. Ample time for discussion and questions allowed. The patient understood, was satisfied, and agreed to proceed. #5. Schedule upper endoscopy to evaluate epigastric pain.The nature of the procedure, as well as the risks, benefits, and alternatives were carefully and thoroughly reviewed with the patient. Ample time for discussion and questions allowed. The patient understood, was satisfied, and agreed to proceed. #6. Discussed massage with warm washcloth for episodes of proctalgia fugax #7. Exercise and weight loss for fatty liver  90 minutes was spent face-to-face with the patient. Greater than 50% a time use for counseling regarding her multiple GI issues and arranging coordination of care  A copy of this consultation note has been sent to Mr. Harvie Heck

## 2017-09-22 ENCOUNTER — Other Ambulatory Visit: Payer: Self-pay | Admitting: Medical

## 2017-09-30 ENCOUNTER — Other Ambulatory Visit: Payer: Self-pay

## 2017-09-30 MED ORDER — METFORMIN HCL 500 MG PO TABS: 500.0000 mg | ORAL_TABLET | Freq: Two times a day (BID) | ORAL | 0 refills | Status: DC

## 2017-09-30 NOTE — Telephone Encounter (Signed)
Ins req 90d of Metformin/refaxed as #180/thx dmf

## 2017-10-05 ENCOUNTER — Telehealth: Payer: Self-pay | Admitting: Family Medicine

## 2017-10-06 NOTE — Telephone Encounter (Signed)
Pt's spouse Eliodomo is requesting pt is needing rx asap since pt is completely out of meds. (pt spouse stated that it is her diabetes medication- did not know the name - think it is this one) Please advise.

## 2017-10-06 NOTE — Telephone Encounter (Signed)
This is [facility].

## 2017-10-13 NOTE — Telephone Encounter (Signed)
Received rx request for atorvastatin pt has been of for a year. Please call and and schedule follow up

## 2017-10-13 NOTE — Telephone Encounter (Signed)
Patient scheduled with PCP for 10/16/2017 at 4pm.

## 2017-10-16 ENCOUNTER — Ambulatory Visit (HOSPITAL_BASED_OUTPATIENT_CLINIC_OR_DEPARTMENT_OTHER)
Admission: RE | Admit: 2017-10-16 | Discharge: 2017-10-16 | Disposition: A | Discharge status: Home / Self Care | Payer: BLUE CROSS/BLUE SHIELD | Source: Ambulatory Visit | Attending: Medical | Admitting: Medical

## 2017-10-16 ENCOUNTER — Ambulatory Visit (INDEPENDENT_AMBULATORY_CARE_PROVIDER_SITE_OTHER): Payer: BLUE CROSS/BLUE SHIELD | Admitting: Medical

## 2017-10-16 ENCOUNTER — Encounter: Payer: Self-pay | Admitting: Medical

## 2017-10-16 ENCOUNTER — Ambulatory Visit: Payer: BLUE CROSS/BLUE SHIELD | Admitting: Medical

## 2017-10-16 VITALS — BP 128/80 | HR 69 | Temp 98.2°F | Resp 16 | Ht 62.0 in | Wt 171.2 lb

## 2017-10-16 DIAGNOSIS — K219 Gastro-esophageal reflux disease without esophagitis: Secondary | ICD-10-CM | POA: Diagnosis not present

## 2017-10-16 DIAGNOSIS — K146 Glossodynia: Secondary | ICD-10-CM | POA: Diagnosis not present

## 2017-10-16 DIAGNOSIS — R221 Localized swelling, mass and lump, neck: Secondary | ICD-10-CM | POA: Diagnosis not present

## 2017-10-16 DIAGNOSIS — Z23 Encounter for immunization: Secondary | ICD-10-CM | POA: Diagnosis not present

## 2017-10-16 DIAGNOSIS — R252 Cramp and spasm: Secondary | ICD-10-CM

## 2017-10-16 DIAGNOSIS — J111 Influenza due to unidentified influenza virus with other respiratory manifestations: Secondary | ICD-10-CM

## 2017-10-16 DIAGNOSIS — E119 Type 2 diabetes mellitus without complications: Secondary | ICD-10-CM | POA: Diagnosis not present

## 2017-10-16 DIAGNOSIS — R5383 Other fatigue: Secondary | ICD-10-CM

## 2017-10-16 DIAGNOSIS — E785 Hyperlipidemia, unspecified: Secondary | ICD-10-CM | POA: Diagnosis not present

## 2017-10-16 MED ORDER — OMEPRAZOLE 40 MG PO CPDR: 40.0000 mg | DELAYED_RELEASE_CAPSULE | Freq: Every day | ORAL | 6 refills | Status: DC

## 2017-10-16 NOTE — Patient Instructions (Addendum)
For your historyof diabetes, I am placing future lab order to include A1c.Labs can be done on October 24.  Continue metformin, low sugar diet and some daily exercise.  For fatigue will include in lab work CBC and CMP.  For burning tongue will include B31, b1, folic acid and iron. Avoid hot and spicy foods during the interim.  For occasional leg cramps will evaluate electrolytes and magnesium.  For reflux I refilled your omeprazole. I do agree with you eventually getting EGD as GI M.D. has planned.  For the left lower neck inflamed area/possible lump/mass, I put in ultrasound order.You can go downstairs and get that scheduled . Follow-up date to be determined after lab review.  All labs will future for oct 24 or later.

## 2017-10-16 NOTE — Progress Notes (Signed)
Subjective:    Patient ID: Bailey Harris, female    DOB: [birth year], 56 y.o.   MRN: [id number]  HPI  Pt in for follow up.  Pt has diabetes. No side effects from metformin. Pt walks twice weekly. She reports eating less sugar. Not exercising.  Pt has slight burning sensation to tongue over past 3 weeks. Had one year ago or so and then stopped. Recently returned. No tongue injury. Ocassiosonal spicy foods.  Pt states her reflux is better with omeprazole. GI has plan to do EGD November 13, 2017.   Pt state she is tired mild-moderate daily basis.  Pt is on lipid medication. She was not fasting on day I saw her. So will get lipid panel when due for a1c.  States some calf cramping and cramping of toes.  On and off one year states mild faint intermittent pain and swelling above left clavicle. No trauma. No fall. Pain is not in clavicle.  Review of Systems  Constitutional: Positive for fatigue. Negative for chills and unexpected weight change.  HENT: Negative for congestion, drooling, facial swelling, hearing loss, postnasal drip, rhinorrhea, sinus pain and sinus pressure.        Burning tongue.  Respiratory: Negative for cough, chest tightness, shortness of breath and wheezing.   Cardiovascular: Negative for chest pain and palpitations.  Gastrointestinal: Positive for abdominal pain. Negative for constipation, nausea and vomiting.       Reflux symptoms.  Endocrine: Negative for polyphagia and polyuria.  Genitourinary: Negative for dysuria.  Musculoskeletal: Negative for back pain, neck pain and neck stiffness.       Cramps.  Skin: Negative for rash.  Neurological: Negative for dizziness, seizures, weakness, numbness and headaches.  Hematological: Negative for adenopathy. Does not bruise/bleed easily.  Psychiatric/Behavioral: Negative for behavioral problems, confusion and self-injury. The patient is not nervous/anxious.     Past Medical History:  Diagnosis Date  . Fatty liver     . GERD (gastroesophageal reflux disease)   . HLD (hyperlipidemia)   . Pancreatic insufficiency    insulin irregularity  . Vitamin D deficiency      Social History   Social History  . Marital status: Married    Spouse name: N/A  . Number of children: 3  . Years of education: N/A   Occupational History  . home maker    Social History Main Topics  . Smoking status: Never Smoker  . Smokeless tobacco: Never Used  . Alcohol use No  . Drug use: No  . Sexual activity: Not on file   Other Topics Concern  . 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 CESAREAN SECTION    . TUBAL LIGATION      Family History  Problem Relation Age of Onset  . Stomach cancer Mother        died at 5 from CA Mets  . CVA Father        died age 91  . CVA Sister        21- died  . CVA Brother        63 died  No Known Allergies  Current Outpatient Prescriptions on File Prior to Visit  Medication Sig Dispense Refill  . aspirin EC 81 MG tablet Take 1 tablet (81 mg total) by mouth daily.    Marland Kitchen atorvastatin (LIPITOR) 40 MG tablet Take 1 tablet (40 mg total) by mouth daily. 90 tablet 3  . Calcium Carbonate (CALCIUM 600 PO) Take 1 tablet by mouth 2 (two) times daily.    . Cholecalciferol (VITAMIN D3) 3000 units TABS Take 1 tablet by mouth daily. 30 tablet   . meloxicam (MOBIC) 7.5 MG tablet Take 1 tablet (7.5 mg total) by mouth daily as needed for pain. 14 tablet 0  . metFORMIN (GLUCOPHAGE) 500 MG tablet Take 1 tablet (500 mg total) by mouth 2 (two) times daily with a meal. 180 tablet 0  . omeprazole (PRILOSEC) 40 MG capsule Take 1 capsule (40 mg total) by mouth daily. 30 capsule 6  . ranitidine (ZANTAC) 150 MG capsule  TAKE 1 CAPSULE (150 MG TOTAL) BY MOUTH 2 (TWO) TIMES DAILY. 60 capsule 0   No current facility-administered medications on file prior to visit.     BP 128/80   Pulse 69   Temp 98.2 F (36.8 C) (Oral)   Resp 16   Ht _0  (1.575 m)   Wt 171 lb 3.2 oz (77.7 kg)   LMP 12/30/2010   SpO2 99%   BMI 31.31 kg/m       Objective:   Physical Exam   General Mental Status- Alert. General Appearance- Not in acute distress.   Skin General: Color- Normal Color. Moisture- Normal Moisture.  Neck Carotid Arteries- Normal color. Moisture- Normal Moisture. No carotid bruits. No JVD.  Chest and Lung Exam Auscultation: Breath Sounds:-Normal.  Cardiovascular Auscultation:Rythm- Regular. Murmurs & Other Heart Sounds:Auscultation of the heart reveals- No Murmurs.  Abdomen Inspection:-Inspeection Normal. Palpation/Percussion:Note:No mass. Palpation and Percussion of the abdomen reveal- Non Tender, Non Distended + BS, no rebound or guarding.    Neurologic Cranial Nerve exam:- CN III-XII intact(No nystagmus), symmetric smile. Strength:- 5/5 equal and symmetric strength both upper and lower extremities.  Mouth- mild cracked appearance to tongue.     Assessment & Plan:  For your historyof diabetes, I am placing future lab order to include A1c.Labs can be done on October 24.  Continue metformin, low sugar diet and some daily exercise.  For fatigue will include in lab work CBC and CMP.  For burning tongue will include J18, b1, folic acid and iron. Avoid hot and spicy foods during the interim.  For occasional leg cramps will evaluate electrolytes and magnesium.  For reflux I refilled your omeprazole. I do agree with you eventually getting EGD as GI M.D. has planned.  For the left lower neck inflamed area/possible lump/mass, I put in ultrasound order.You can go downstairs and get that scheduled . Follow-up date to be determined after lab review.  Hlee Fringer, Percell Miller, PA-C

## 2017-10-17 ENCOUNTER — Telehealth: Payer: Self-pay | Admitting: Medical

## 2017-10-17 NOTE — Telephone Encounter (Signed)
-----   Message from Mackie Pai, PA-C sent at 10/16/2017 10:30 PM EDT ----- Would you let patient know that the ultrasound of area did not show any mass or bnormal lymph node. I think she has some diffuse fat/fulness in the area. But we can still periodically check the area on physical exam. If any change could consider further imaging studies if exam of area changes.

## 2017-10-17 NOTE — Telephone Encounter (Signed)
Spoke with spouse who is on DPR about pt results and understood the results will informed wife about results.

## 2017-10-23 ENCOUNTER — Telehealth: Payer: Self-pay | Admitting: Medical

## 2017-10-23 ENCOUNTER — Other Ambulatory Visit (INDEPENDENT_AMBULATORY_CARE_PROVIDER_SITE_OTHER): Payer: BLUE CROSS/BLUE SHIELD

## 2017-10-23 DIAGNOSIS — K146 Glossodynia: Secondary | ICD-10-CM

## 2017-10-23 DIAGNOSIS — E119 Type 2 diabetes mellitus without complications: Secondary | ICD-10-CM

## 2017-10-23 DIAGNOSIS — R252 Cramp and spasm: Secondary | ICD-10-CM | POA: Diagnosis not present

## 2017-10-23 DIAGNOSIS — R5383 Other fatigue: Secondary | ICD-10-CM | POA: Diagnosis not present

## 2017-10-23 LAB — CBC WITH DIFFERENTIAL/PLATELET
BASOS ABS: 0 10*3/uL (ref 0.0–0.1)
Basophils Relative: 0.9 % (ref 0.0–3.0)
EOS ABS: 0.1 10*3/uL (ref 0.0–0.7)
Eosinophils Relative: 1.6 % (ref 0.0–5.0)
HCT: 40.2 % (ref 36.0–46.0)
Hemoglobin: 13.6 g/dL (ref 12.0–15.0)
LYMPHS ABS: 1.7 10*3/uL (ref 0.7–4.0)
Lymphocytes Relative: 40.1 % (ref 12.0–46.0)
MCHC: 33.7 g/dL (ref 30.0–36.0)
MCV: 94.3 fl (ref 78.0–100.0)
Monocytes Absolute: 0.4 10*3/uL (ref 0.1–1.0)
Monocytes Relative: 10.5 % (ref 3.0–12.0)
NEUTROS ABS: 2 10*3/uL (ref 1.4–7.7)
NEUTROS PCT: 46.9 % (ref 43.0–77.0)
PLATELETS: 244 10*3/uL (ref 150.0–400.0)
RBC: 4.27 Mil/uL (ref 3.87–5.11)
RDW: 12.5 % (ref 11.5–15.5)
WBC: 4.3 10*3/uL (ref 4.0–10.5)

## 2017-10-23 LAB — COMPREHENSIVE METABOLIC PANEL
ALK PHOS: 79 U/L (ref 39–117)
ALT: 67 U/L — AB (ref 0–35)
AST: 39 U/L — ABNORMAL HIGH (ref 0–37)
Albumin: 4.4 g/dL (ref 3.5–5.2)
BILIRUBIN TOTAL: 1 mg/dL (ref 0.2–1.2)
BUN: 14 mg/dL (ref 6–23)
CO2: 28 mEq/L (ref 19–32)
Calcium: 9.7 mg/dL (ref 8.4–10.5)
Chloride: 104 mEq/L (ref 96–112)
Creatinine, Ser: 0.64 mg/dL (ref 0.40–1.20)
GFR: 102.35 mL/min (ref >60.00)
GLUCOSE: 128 mg/dL — AB (ref 70–99)
Potassium: 4.4 mEq/L (ref 3.5–5.1)
Sodium: 140 mEq/L (ref 135–145)
TOTAL PROTEIN: 7.4 g/dL (ref 6.0–8.3)

## 2017-10-23 LAB — IRON: IRON: 135 ug/dL (ref 42–145)

## 2017-10-23 LAB — MAGNESIUM: MAGNESIUM: 1.9 mg/dL (ref 1.5–2.5)

## 2017-10-23 LAB — VITAMIN B12: VITAMIN B 12: 586 pg/mL (ref 211–911)

## 2017-10-23 LAB — TSH: TSH: 6.15 u[IU]/mL — AB (ref 0.35–4.50)

## 2017-10-23 LAB — HEMOGLOBIN A1C: Hgb A1c MFr Bld: 6.7 % — ABNORMAL HIGH (ref 4.6–6.5)

## 2017-10-23 LAB — FOLATE: Folate: 12.2 ng/mL (ref >5.9)

## 2017-10-23 MED ORDER — LEVOTHYROXINE SODIUM 50 MCG PO TABS
50.0000 ug | ORAL_TABLET | Freq: Every day | ORAL | 0 refills | Status: DC
Start: 2017-10-23 — End: 2018-01-22

## 2017-10-23 NOTE — Telephone Encounter (Signed)
Low-dose levothyroxine sent to the pharmacy.  Advised her again repeat thyroid level in 3 months.

## 2017-10-24 NOTE — Telephone Encounter (Signed)
Called pt and spoke with spouse (Bailey Harris -on the DPR) and explained him the information below and scheduled with spouse pt's next lab appt in 3 months. Pt's spouse that pt would like to know her lab results. Please advise.

## 2017-10-28 ENCOUNTER — Telehealth: Payer: Self-pay | Admitting: Medical

## 2017-10-28 LAB — VITAMIN B1: Vitamin B1 (Thiamine): 6 nmol/L — ABNORMAL LOW (ref 8–30)

## 2017-10-28 NOTE — Telephone Encounter (Signed)
-----   Message from Mackie Pai, PA-C sent at 10/23/2017  8:59 PM EDT ----- Patient's iron level and magnesium normal  Sugar at the time of lab draw was 128.  She has very minimal liver enzyme elevation.  We will follow this on future labs.  Advised to avoid excess use of Tylenol and avoid any alcohol use.  Kidney function looks good.  B12 and folate also normal.  Patient's TSH is high indicating low thyroid function.  This is likely the cause of her fatigue.  I will write low-dose thyroid medication and send it to her pharmacy.  We will repeat her thyroid function in 3 months.  Also will repeat her sugar average in 3 months.  Her recent sugar average was improved to a level probably around 150 on average.  Continue low sugar diet, exercise and metformin.  Patient has no anemia.  Normal infection fighting cells.

## 2017-10-28 NOTE — Telephone Encounter (Signed)
Spoke with spouse Daneil Dan- on [place]) was informed about the results and spouse stated will let pt know and also was already schedule for her lab appt in 01-26-2018.

## 2017-11-04 NOTE — Telephone Encounter (Signed)
Pt was informed about her B1 and understood, pt will buy the vitamins OC at the pharmacy and she has already appt for labs.

## 2017-11-04 NOTE — Telephone Encounter (Signed)
-----   Message from Mackie Pai, PA-C sent at 10/28/2017  5:35 PM EDT ----- Patient's B1 vitamin was low.  Advised to get B1 and take daily.  We will see if this helps her energy.  We can repeat a B1 level in about 3 months.

## 2017-11-05 ENCOUNTER — Other Ambulatory Visit: Payer: Self-pay

## 2017-11-05 MED ORDER — RANITIDINE HCL 150 MG PO CAPS: 150.0000 mg | ORAL_CAPSULE | Freq: Two times a day (BID) | ORAL | 0 refills | Status: DC

## 2017-11-13 ENCOUNTER — Encounter: Payer: BLUE CROSS/BLUE SHIELD | Admitting: Internal Medicine

## 2017-12-11 ENCOUNTER — Encounter: Payer: BLUE CROSS/BLUE SHIELD | Admitting: Internal Medicine

## 2018-01-01 ENCOUNTER — Other Ambulatory Visit: Payer: Self-pay | Admitting: Medical

## 2018-01-22 ENCOUNTER — Encounter: Payer: Self-pay | Admitting: Medical

## 2018-01-22 ENCOUNTER — Encounter (HOSPITAL_BASED_OUTPATIENT_CLINIC_OR_DEPARTMENT_OTHER): Payer: Self-pay

## 2018-01-22 ENCOUNTER — Other Ambulatory Visit: Payer: Self-pay | Admitting: Medical

## 2018-01-22 ENCOUNTER — Ambulatory Visit (HOSPITAL_BASED_OUTPATIENT_CLINIC_OR_DEPARTMENT_OTHER): Payer: BLUE CROSS/BLUE SHIELD

## 2018-01-22 ENCOUNTER — Telehealth: Payer: Self-pay

## 2018-01-22 ENCOUNTER — Ambulatory Visit (INDEPENDENT_AMBULATORY_CARE_PROVIDER_SITE_OTHER): Payer: BLUE CROSS/BLUE SHIELD | Admitting: Medical

## 2018-01-22 VITALS — BP 109/66 | HR 71 | Temp 98.2°F | Resp 16 | Ht 62.0 in | Wt 169.8 lb

## 2018-01-22 DIAGNOSIS — Z0000 Encounter for general adult medical examination without abnormal findings: Secondary | ICD-10-CM

## 2018-01-22 DIAGNOSIS — R109 Unspecified abdominal pain: Secondary | ICD-10-CM

## 2018-01-22 DIAGNOSIS — R5383 Other fatigue: Secondary | ICD-10-CM | POA: Diagnosis not present

## 2018-01-22 DIAGNOSIS — E119 Type 2 diabetes mellitus without complications: Secondary | ICD-10-CM | POA: Diagnosis not present

## 2018-01-22 LAB — CBC WITH DIFFERENTIAL/PLATELET
Basophils Absolute: 0.1 10*3/uL (ref 0.0–0.1)
Basophils Relative: 1.4 % (ref 0.0–3.0)
Eosinophils Absolute: 0.1 10*3/uL (ref 0.0–0.7)
Eosinophils Relative: 1.6 % (ref 0.0–5.0)
HEMATOCRIT: 41 % (ref 36.0–46.0)
Hemoglobin: 13.9 g/dL (ref 12.0–15.0)
LYMPHS PCT: 36.9 % (ref 12.0–46.0)
Lymphs Abs: 1.4 10*3/uL (ref 0.7–4.0)
MCHC: 34 g/dL (ref 30.0–36.0)
MCV: 93.5 fl (ref 78.0–100.0)
MONOS PCT: 9.4 % (ref 3.0–12.0)
Monocytes Absolute: 0.4 10*3/uL (ref 0.1–1.0)
Neutro Abs: 1.9 10*3/uL (ref 1.4–7.7)
Neutrophils Relative %: 50.7 % (ref 43.0–77.0)
Platelets: 251 10*3/uL (ref 150.0–400.0)
RBC: 4.38 Mil/uL (ref 3.87–5.11)
RDW: 12.7 % (ref 11.5–15.5)
WBC: 3.7 10*3/uL — ABNORMAL LOW (ref 4.0–10.5)

## 2018-01-22 LAB — COMPREHENSIVE METABOLIC PANEL
ALK PHOS: 96 U/L (ref 39–117)
ALT: 74 U/L — AB (ref 0–35)
AST: 44 U/L — ABNORMAL HIGH (ref 0–37)
Albumin: 4.3 g/dL (ref 3.5–5.2)
BILIRUBIN TOTAL: 0.9 mg/dL (ref 0.2–1.2)
BUN: 13 mg/dL (ref 6–23)
CALCIUM: 9.6 mg/dL (ref 8.4–10.5)
CO2: 29 mEq/L (ref 19–32)
Chloride: 105 mEq/L (ref 96–112)
Creatinine, Ser: 0.64 mg/dL (ref 0.40–1.20)
GFR: 102.26 mL/min (ref >60.00)
Glucose, Bld: 149 mg/dL — ABNORMAL HIGH (ref 70–99)
Potassium: 4.7 mEq/L (ref 3.5–5.1)
Sodium: 141 mEq/L (ref 135–145)
TOTAL PROTEIN: 7.6 g/dL (ref 6.0–8.3)

## 2018-01-22 LAB — LIPID PANEL
CHOL/HDL RATIO: 6
CHOLESTEROL: 217 mg/dL — AB (ref 0–200)
HDL: 37.8 mg/dL — ABNORMAL LOW (ref >39.00)
LDL Cholesterol: 151 mg/dL — ABNORMAL HIGH (ref 0–99)
NonHDL: 179.1
Triglycerides: 142 mg/dL (ref 0.0–149.0)
VLDL: 28.4 mg/dL (ref 0.0–40.0)

## 2018-01-22 LAB — URINALYSIS, ROUTINE W REFLEX MICROSCOPIC
Bilirubin Urine: NEGATIVE
Nitrite: NEGATIVE
SPECIFIC GRAVITY, URINE: 1.02 (ref 1.000–1.030)
URINE GLUCOSE: NEGATIVE
UROBILINOGEN UA: 0.2 (ref 0.0–1.0)
pH: 7 (ref 5.0–8.0)

## 2018-01-22 LAB — TSH: TSH: 3.51 u[IU]/mL (ref 0.35–4.50)

## 2018-01-22 LAB — HEMOGLOBIN A1C: Hgb A1c MFr Bld: 7.4 % — ABNORMAL HIGH (ref 4.6–6.5)

## 2018-01-22 MED ORDER — RANITIDINE HCL 150 MG PO CAPS: 150.0000 mg | ORAL_CAPSULE | Freq: Two times a day (BID) | ORAL | 2 refills | Status: DC

## 2018-01-22 NOTE — Patient Instructions (Addendum)
For you wellness exam today I have ordered cbc, cmp, tsh, lipid panel, and ua.  Check a1c today as well.  Vaccine up to date.  Recommend exercise and healthy diet.  We will let you know lab results as they come in.  Follow up date appointment will be determined after lab review.   For abdomen pain do get US abdomen today at 11:30.   Cuidados preventivos entre os 40-64 anos, mulheres Preventive Care 40-64 Years, Female Cuidados preventivos se referem a escolhas de estilo de vida e consultas com seu mdico capazes de promover a sade e o bem-estar. O que os cuidados preventivos incluem?  Um exame fsico anual. Ele tambm  chamado check-up anual.  Exames dentais uma ou duas vezes ao ano.  Exames oftalmolgicos rotineiros. Pergunte ao seu mdico com que frequncia seus olhos devem ser examinados.  Escolhas pessoais de estilo de vida, incluindo: ? Cuidado dirio dos seus dentes e gengivas. ? Atividade fsica regular. ? Consumir uma dieta saudvel. ? Evitar o tabaco e o uso de drogas. ? Limitar o uso de lcool. ? Academic librarian. ? Tomar baixa dose de aspirina diariamente comeando aos 50 anos de idade. ? Tomar suplementos de vitaminas e minerais de acordo com as recomendaes do seu mdico. O que acontece durante o check-up anual? Os servios e exames preventivos realizados pelo seu mdico durante o check-up anual dependero da sua idade, sade geral, fatores de risco relacionados ao estilo de vida e histrico familiar de doenas. Aconselhamento Seu mdico tambm poder fazer perguntas sobre:  Uso de lcool.  Uso de tabaco.  Uso de drogas.  O bem-estar emocional.  O bem-estar em casa e nos relacionamentos.  A atividade sexual.  Os hbitos alimentares.  O trabalho e o ambiente de trabalho.  O mtodo anticoncepcional.  O ciclo menstrual.  O histrico de gravidez.  Exames preventivos Voc poder fazer os testes ou medies a seguir:  Suzanna Obey e [facility]  (ndice de Art therapist).  Presso arterial.  Nveis de lipdeos e colesterol. Esses nveis podero ser verificados a cada 5 anos ou com maior frequncia caso voc tenha mais de 50 anos de idade.  [place].  Exame preventivo de cncer de pulmo. Voc poder fazer esse exame preventivo comeando aos 55 anos se tiver histrico de consumo de 30 unidades mao-ano e fume atualmente ou tenha parado nos ltimos 15 anos.  Exame de sangue oculto nas fezes (FOBT). Voc poder fazer este exame todos os anos comeando aos 50 anos.  Colonoscopia ou sigmoidoscopia flexvel. Voc poder fazer uma sigmoidoscopia a cada 5 anos ou uma colonoscopia a cada 10 anos comeando aos 50 anos.  Exame de sangue para hepatite C.  Exame de sangue para hepatite B.  Exame para doenas sexualmente transmissveis (DSTs).  Exame preventivo de diabetes. Isso  feito verificando-se o acar (glicose) no sangue depois de voc no comer por algum tempo (jejum). Isso poder ser feito a cada 1-3 anos.  Mamografia. Poder ser feita a cada 1-2 anos. Converse com seu mdico sobre a frequncia com a Community education officer. Isso poder depender de voc ter ou no histrico familiar de cncer de mama.  Exame de preveno do cncer baseado em BRCA. Esse exame poder ser realizado caso voc tenha histrico de cncer de mama, de ovrio, das tubas uterinas ou de peritnio.  Exame plvico e de Papanicolau. Isso poder ser [employer] a cada 3 anos a [employer] 21 anos de idade. Comeando aos 30  anos de idade, esses exames podero ser feitos a cada 5 anos caso voc realize um exame de Papanicolau junto com um teste de HPV.  Exame da densidade ssea. Esse exame  feito para verificar a existncia de osteoporose. Voc poder fazer esse exame caso tenha risco elevado de osteoporose.  Discuta seus resultados, opes de tratamento e, se necessrio, a necessidade de mais exames com seu mdico. Vacinas Seu  mdico poder recomendar certas vacinas, tais como:  Vacina contra gripe. Essa vacina  recomendada todos os anos.  Vacina contra ttano, difteria e coqueluche acelular (Tdap, Td). Voc poder precisar de um reforo da Td a cada 10 anos.  Vacina contra varicela (catapora). Voc poder precisar dessa vacina se ainda no tiver sido vacinado.  Vacina contra o herpes zster. Voc poder precisar dela a [employer] 60 anos.  Vacina contra sarampo, rubola e caxumba (MMR). Voc poder precisar de pelo menos uma dose da MMR se tiver nascido em 1957 ou posteriormente. Voc tambm poder precisar de uma segunda dose.  Vacina pneumoccica 13-valente conjugada (PCV13). Voc poder precisar dessa vacina se sofrer de certos quadros clnicos e ainda no tiver [employer].  Vacina pneumoccica polissacardica (PPSV23). Voc poder precisar de uma ou duas doses caso fume cigarros ou sofra de certos quadros clnicos.  Vacina meningoccica. Voc poder precisar dela se sofrer de certos quadros clnicos.  Vacina contra hepatite A. Voc poder precisar dessa vacina se apresentar certos quadros clnicos ou viajar para ou trabalhar em lugares nos quais pode ser exposto  hepatite A.  Vacina contra hepatite B. Voc poder precisar dessa vacina se apresentar certos quadros clnicos ou viajar para ou trabalhar em lugares nos quais pode ser exposto  hepatite B.  Vacina contra o Haemophilus influenzae tipo b (Hib). Voc poder precisar dela se sofrer de certos quadros clnicos.  Converse com seu mdico sobre quais exames preventivos e vacinas voc precisar e com qual frequncia. Estas informaes no se destinam a substituir as recomendaes de seu mdico. No deixe de discutir quaisquer dvidas com seu mdico. Document Released: 04/22/2017 Document Revised: 04/22/2017 Elsevier Interactive Patient Education  [employer].

## 2018-01-22 NOTE — Progress Notes (Signed)
Subjective:    Patient ID: Bailey Harris, female    DOB: 19-Feb-1961, 57 y.o.   MRN: [id number]  HPI  Pt in for physical exam.   Pt is fasting today.  Up to date on flu vaccine. Up to date on colonoscopy. Up to date on mammogram Tdap up to date.   Patient reviews with me her history of chronic epigastric pain with some right upper quadrant pain.  Please see below.    Review of Systems  Constitutional: Negative for chills, fatigue and fever.  HENT: Negative for dental problem, ear discharge, ear pain, facial swelling, sinus pressure and sinus pain.   Respiratory: Negative for cough, chest tightness, shortness of breath and wheezing.   Cardiovascular: Negative for chest pain and palpitations.  Gastrointestinal: Positive for abdominal pain. Negative for anal bleeding, constipation, nausea and vomiting.       Pt thinks thyroid med if uses on empty stomach bothers her stomach.    Pt has history of epigastric pain and ruq pain after eating. Some back pain as well. In past went to GI. They advised egd. Pt never got done due to cost $2600.  Also on and off constipation and diarrhea. Describes last 2-3 days constipation then 2-3  days loose stools  Genitourinary: Negative for dysuria, flank pain, frequency and urgency.  Musculoskeletal: Negative for back pain, myalgias, neck pain and neck stiffness.  Skin: Negative for rash.  Neurological: Negative for dizziness, weakness, numbness and headaches.  Hematological: Negative for adenopathy. Does not bruise/bleed easily.  Psychiatric/Behavioral: Negative for behavioral problems, confusion and suicidal ideas. The patient is not nervous/anxious.    Past Medical History:  Diagnosis Date  . Fatty liver   . GERD (gastroesophageal reflux disease)   . HLD (hyperlipidemia)   . Pancreatic insufficiency    insulin irregularity  . Vitamin D deficiency      Social History   Socioeconomic History  . Marital status: Married    Spouse name:  Not on file  . Number of children: 3  . Years of education: Not on file  . Highest education level: Not on file  Social Needs  . Financial resource strain: Not on file  . Food insecurity - worry: Not on file  . Food insecurity - inability: Not on file  . Transportation needs - medical: Not on file  . Transportation needs - non-medical: Not on file  Occupational History  . Occupation: home maker  Tobacco Use  . Smoking status: Never Smoker  . Smokeless tobacco: Never Used  Substance and Sexual Activity  . Alcohol use: No    Alcohol/week: 0.0 oz  . Drug use: No  . Sexual activity: Not on file  Other Topics Concern  . Not on file  Social History Narrative   Married   3 children daughter- [place]   Has worked in Dentist up in [place], husband is from [place], [place] in [place]   Completed very little education, can read a little in [place]          Past Surgical History:  Procedure Laterality Date  . CESAREAN SECTION    . TUBAL LIGATION      Family History  Problem Relation Age of Onset  . Stomach cancer Mother        died at 70 from CA Mets  . CVA Father        died age 73  . CVA Sister  47- died  . CVA Brother        57 died    No Known Allergies  Current Outpatient Medications on File Prior to Visit  Medication Sig Dispense Refill  . aspirin EC 81 MG tablet Take 1 tablet (81 mg total) by mouth daily.    Marland Kitchen atorvastatin (LIPITOR) 40 MG tablet Take 1 tablet (40 mg total) by mouth daily. 90 tablet 3  . Calcium Carbonate (CALCIUM 600 PO) Take 1 tablet by mouth 2 (two) times daily.    . Cholecalciferol (VITAMIN D3) 3000 units TABS Take 1 tablet by mouth daily. 30 tablet   . levothyroxine (SYNTHROID, LEVOTHROID) 50 MCG tablet Take 1 tablet (50 mcg total) by mouth daily. 90 tablet 0  . meloxicam (MOBIC) 7.5 MG tablet Take 1 tablet (7.5 mg total) by mouth daily as needed for pain. 14 tablet 0  . metFORMIN (GLUCOPHAGE) 500 MG tablet  TAKE 1 TABLET (500 MG TOTAL) BY MOUTH 2 (TWO) TIMES DAILY WITH A MEAL. 180 tablet 0  . omeprazole (PRILOSEC) 40 MG capsule Take 1 capsule (40 mg total) by mouth daily. 30 capsule 6  . ranitidine (ZANTAC) 150 MG capsule Take 1 capsule (150 mg total) 2 (two) times daily by mouth. 60 capsule 0   No current facility-administered medications on file prior to visit.     BP 109/66   Pulse 71   Temp 98.2 F (36.8 C) (Oral)   Resp 16   Ht 5' 2"  (1.575 m)   Wt 169 lb 12.8 oz (77 kg)   LMP 12/30/2010   SpO2 98%   BMI 31.06 kg/m      Objective:   Physical Exam  General Mental Status- Alert. General Appearance- Not in acute distress.   Skin General: Color- Normal Color. Moisture- Normal Moisture.  No worrisome lesions.  Does have small scattered moles on her back but none currently worrisome.  Neck Carotid Arteries- Normal color. Moisture- Normal Moisture. No carotid bruits. No JVD.  Chest and Lung Exam Auscultation: Breath Sounds:-Normal.  Cardiovascular Auscultation:Rythm- Regular. Murmurs & Other Heart Sounds:Auscultation of the heart reveals- No Murmurs.  Abdomen Inspection:-Inspeection Normal. Palpation/Percussion:Note:No mass. Palpation and Percussion of the abdomen reveal- epigastric and rt upper quadrant Tender, Non Distended + BS, no rebound or guarding.   Neurologic Cranial Nerve exam:- CN III-XII intact(No nystagmus), symmetric smile. Strength:- 5/5 equal and symmetric strength both upper and lower extremities.      Assessment & Plan:  For you wellness exam today I have ordered cbc, cmp, tsh, lipid panel, and ua.  Vaccine up to date.  Recommend exercise and healthy diet.  We will let you know lab results as they come in.  Follow up date appointment will be determined after lab review.   For abdomen pain do get US abdomen today at 11:30.  Otniel Hoe, Percell Miller, PA-C

## 2018-01-22 NOTE — Telephone Encounter (Signed)
Copied from [place] [id number]. Topic: Inquiry >> Jan 22, 2018 12:58 PM Bailey Harris wrote: Patient said she did not do the ultrasound bc it was expensive. They want to know what to further do. Call back is [phone]

## 2018-01-22 NOTE — Telephone Encounter (Signed)
Patient did not want to get ultrasound due to the cost.  They want me to call back and advise them what to do.  Would you help and call them and explained that I think it would be beneficial.  The lab work the other day did show some liver enzyme elevation.  Regarding  Cost could you call downstairs and ask how much it would be?  Other payment options.  The only other recommendation I I can think of is for her to call her insurance company and ask if they have a preferred place for imaging.  It is possible it would be less expensive at another imaging facility other than the med center.  If she finds that [facility] has a preferred facility and she gets has the name and number to try to fax an order to that facility.  Patient is a [place] speaker so please call and help me out.  Thanks

## 2018-01-24 ENCOUNTER — Telehealth: Payer: Self-pay | Admitting: Medical

## 2018-01-24 MED ORDER — METFORMIN HCL 1000 MG PO TABS: 1000.0000 mg | ORAL_TABLET | Freq: Two times a day (BID) | ORAL | 3 refills | Status: DC

## 2018-01-24 NOTE — Telephone Encounter (Signed)
Prescription of higher dose metformin sent to patient's pharmacy.

## 2018-01-26 ENCOUNTER — Other Ambulatory Visit: Payer: BLUE CROSS/BLUE SHIELD

## 2018-01-26 NOTE — Telephone Encounter (Signed)
Spoke with pt's spouse (spouse on [place]) and explained the below, spouse indicates that called health insurance on Thursday to verify where would insurance cover ultrasound, pt is still awaiting for insurance to call back, pt will call us to indicate where would insurance cover ultrasound. Also spouse was informed that pt has a rx to pick up at pharmacy (metformin).

## 2018-01-28 ENCOUNTER — Telehealth: Payer: Self-pay

## 2018-01-28 ENCOUNTER — Telehealth: Payer: Self-pay | Admitting: Medical

## 2018-01-28 MED ORDER — ATORVASTATIN CALCIUM 40 MG PO TABS: 40.0000 mg | ORAL_TABLET | Freq: Every day | ORAL | 3 refills | Status: DC

## 2018-01-28 NOTE — Telephone Encounter (Signed)
Pt is requesting Lipitor. Pt has not been taking medication. Okay to send in refill.

## 2018-01-28 NOTE — Telephone Encounter (Signed)
Refill her atorvastatin.

## 2018-02-23 ENCOUNTER — Encounter: Payer: Self-pay | Admitting: Internal Medicine

## 2018-03-04 ENCOUNTER — Other Ambulatory Visit: Payer: BLUE CROSS/BLUE SHIELD

## 2018-03-11 ENCOUNTER — Telehealth: Payer: Self-pay | Admitting: Medical

## 2018-03-11 ENCOUNTER — Ambulatory Visit
Admission: RE | Admit: 2018-03-11 | Discharge: 2018-03-11 | Disposition: A | Discharge status: Home / Self Care | Payer: BLUE CROSS/BLUE SHIELD | Source: Ambulatory Visit | Attending: Medical | Admitting: Medical

## 2018-03-11 ENCOUNTER — Telehealth: Payer: Self-pay | Admitting: *Deleted

## 2018-03-11 DIAGNOSIS — K8020 Calculus of gallbladder without cholecystitis without obstruction: Secondary | ICD-10-CM | POA: Diagnosis not present

## 2018-03-11 DIAGNOSIS — K760 Fatty (change of) liver, not elsewhere classified: Secondary | ICD-10-CM | POA: Diagnosis not present

## 2018-03-11 DIAGNOSIS — R109 Unspecified abdominal pain: Secondary | ICD-10-CM

## 2018-03-11 NOTE — Telephone Encounter (Signed)
Ricard Dillon, Ultrasound Technician with [facility] called with the Abdominal US report. She says the report was read by Dr. Carolynn Serve and says Cholelithiasis without cholecystitis. Hepatic steatosis. Result read back and verified. Results in chart.

## 2018-03-11 NOTE — Telephone Encounter (Signed)
Reviewed ultrasound report tonight.  Message sent to Spanish-speaking staff to call patient.

## 2018-03-11 NOTE — Telephone Encounter (Signed)
    Bailey Harris,  I spoke with the patient's husband listed on her DPR patient was not available. He said she did not want EGD at this time because insurance will not cover it.   Thank you,  [name]   Previous Messages    ----- Message -----  From: Laverna Peace, RN  Sent: 03/11/2018  2:21 PM  To: Coolidge Breeze,   This is a Spanish speaking patient, so I was hoping you could help me. She saw Dr. Henrene Pastor 09-11-17 and he had recommended her to have both an EGD and colonoscopy. She had to cancel her original appt and was rescheduled for a colonoscopy only. Do you mind giving her a call to clarify this for me? I would need to reschedule her to a 60 minute appt.   Thanks,  [name]

## 2018-03-12 ENCOUNTER — Other Ambulatory Visit: Payer: Self-pay

## 2018-03-12 ENCOUNTER — Telehealth: Payer: Self-pay | Admitting: Medical

## 2018-03-12 DIAGNOSIS — K8020 Calculus of gallbladder without cholecystitis without obstruction: Secondary | ICD-10-CM

## 2018-03-12 NOTE — Telephone Encounter (Signed)
Referral to general surgeon placed. 

## 2018-03-16 ENCOUNTER — Telehealth: Payer: Self-pay | Admitting: *Deleted

## 2018-03-16 NOTE — Telephone Encounter (Signed)
Sent Gwen message to see if we have to call or can patient call to get something sooner.

## 2018-03-16 NOTE — Telephone Encounter (Signed)
Copied from [place]. Topic: Referral - Question >> Mar 16, 2018 11:45 AM Valla Leaver wrote: Reason for CRM: Husband, Bailey Harris, calling to ask if PA Saguier can change his wife's referral to [facility] w/ Dr. Ralene Ok to a different doctor b/c the first appt is 03/27 8:50am consult and she feels like that's too long to wait. Please call couple back to discuss b/c she is in pain.

## 2018-03-25 ENCOUNTER — Ambulatory Visit: Payer: Self-pay | Admitting: General Surgery

## 2018-03-25 DIAGNOSIS — K8020 Calculus of gallbladder without cholecystitis without obstruction: Secondary | ICD-10-CM | POA: Diagnosis not present

## 2018-03-25 NOTE — H&P (Signed)
History of Present Illness Ralene Ok MD; 03/25/2018 9:49 AM) The patient is a 57 year old female who presents for evaluation of gall stones. Referred by: Denyse Amass, PA Chief Complaint: Midepigastric abdominal pain  Patient is a 57 year old female, Spanish-speaking has a history of hyperlipidemia, diabetes, hypothyroidism, who comes in with epigastric abdominal pain for several years. She states that most recently she's had some epigastric abdominal pain that radiates to the mid back. She states that this feels knifelike in nature. She states this is associated with eating higher fatty foods. She states that she also has some nausea with these symptoms. She states that recently she has been sent away for many high-fat foods and fit on a bland diet.  Patient underwent ultrasound which revealed cholelithiasis without any signs of cholecystitis. I did review this personally.    Diagnostic Studies History Erline Levine, RN; 03/25/2018 9:37 AM) Colonoscopy  5-10 years ago Mammogram  within last year Pap Smear  1-5 years ago  Allergies Erline Levine, RN; 03/25/2018 9:37 AM) No Known Drug Allergies [03/25/2018]: Allergies Reconciled   Medication History Erline Levine, RN; 03/25/2018 9:39 AM) Lipitor (40MG  Tablet, Oral) Active. MetFORMIN HCl (1000MG  Tablet, Oral) Active. Synthroid (75MCG Tablet, Oral) Active. Zantac (150MG /6ML Solution, Injection) Active. PriLOSEC OTC (20MG  Tablet DR, Oral) Active. Mobic (7.5MG  Tablet, Oral) Active. Medications Reconciled  Social History Erline Levine, RN; 03/25/2018 9:37 AM) Alcohol use  Occasional alcohol use. Caffeine use  Carbonated beverages, Coffee, Tea. No drug use  Tobacco use  Never smoker.  Family History Erline Levine, RN; 03/25/2018 9:37 AM) Family history unknown  First Degree Relatives   Pregnancy / Birth History Erline Levine, RN; 03/25/2018 9:37 AM) Age at menarche  60 years. Age of menopause  7-50 Gravida   3 Irregular periods  Length (months) of breastfeeding  7-12 Maternal age  10-20 Para  3  Other Problems Erline Levine, RN; 03/25/2018 9:37 AM) Back Pain  Bladder Problems  Hemorrhoids     Review of Systems Ralene Ok MD; 03/25/2018 9:47 AM) General Present- Fatigue. Not Present- Appetite Loss, Chills, Fever, Night Sweats, Weight Gain and Weight Loss. Skin Not Present- Change in Wart/Mole, Dryness, Hives, Jaundice, New Lesions, Non-Healing Wounds, Rash and Ulcer. Respiratory Not Present- Cough and Difficulty Breathing. Breast Not Present- Breast Mass, Breast Pain, Nipple Discharge and Skin Changes. Cardiovascular Not Present- Chest Pain, Difficulty Breathing Lying Down, Leg Cramps, Palpitations, Rapid Heart Rate, Shortness of Breath and Swelling of Extremities. Gastrointestinal Present- Abdominal Pain and Gets full quickly at meals. Not Present- Bloating, Bloody Stool, Change in Bowel Habits, Chronic diarrhea, Constipation, Difficulty Swallowing, Excessive gas, Hemorrhoids, Indigestion, Nausea, Rectal Pain and Vomiting. Female Genitourinary Present- Painful Urination. Not Present- Frequency, Nocturia, Pelvic Pain and Urgency. Musculoskeletal Present- Back Pain and Muscle Pain. Not Present- Joint Pain, Joint Stiffness, Muscle Weakness and Swelling of Extremities. Neurological Not Present- Decreased Memory, Fainting, Headaches, Numbness, Seizures, Tingling, Tremor, Trouble walking and Weakness. Psychiatric Not Present- Anxiety, Bipolar, Change in Sleep Pattern, Depression, Fearful and Frequent crying. Endocrine Not Present- Cold Intolerance, Excessive Hunger, Hair Changes, Heat Intolerance, Hot flashes and New Diabetes. All other systems negative  Vitals Erline Levine RN; 03/25/2018 9:40 AM) 03/25/2018 9:39 AM Weight: 169.5 lb Height: 64in Body Surface Area: 1.82 m Body Mass Index: 29.09 kg/m  Temp.: 98.33F(Oral)  Pulse: 70 (Regular)  P.OX: 99% (Room air) BP:  124/76 (Sitting, Left Arm, Standard)       Physical Exam Ralene Ok MD; 03/25/2018 9:49 AM) The physical exam findings are as  follows: Note:Constitutional: No acute distress, conversant, appears stated age  Eyes: Anicteric sclerae, moist conjunctiva, no lid lag  Neck: No thyromegaly, trachea midline, no cervical lymphadenopathy  Lungs: Clear to auscultation biilaterally, normal respiratory effot  Cardiovascular: regular rate & rhythm, no murmurs, no peripheal edema, pedal pulses 2+  GI: Soft, no masses or hepatosplenomegaly, non-tender to palpation  MSK: Normal gait, no clubbing cyanosis, edema  Skin: No rashes, palpation reveals normal skin turgor  Psychiatric: Appropriate judgment and insight, oriented to person, place, and time    Assessment & Plan Ralene Ok MD; 03/25/2018 9:49 AM) SYMPTOMATIC CHOLELITHIASIS (K80.20) Impression: 57 year old female with symptomatic cholelithiasis  1. We will proceed to the operating room for a laparoscopic cholecystectomy with IOC  2. Risks and benefits were discussed with the patient to generally include, but not limited to: infection, bleeding, possible need for post op ERCP, damage to the bile ducts, bile leak, and possible need for further surgery. Alternatives were offered and described. All questions were answered and the patient voiced understanding of the procedure and wishes to proceed at this point with a laparoscopic cholecystectomy

## 2018-03-25 NOTE — H&P (View-Only) (Signed)
History of Present Illness Ralene Ok MD; 03/25/2018 9:49 AM) The patient is a 57 year old female who presents for evaluation of gall stones. Referred by: Denyse Amass, PA Chief Complaint: Midepigastric abdominal pain  Patient is a 57 year old female, Spanish-speaking has a history of hyperlipidemia, diabetes, hypothyroidism, who comes in with epigastric abdominal pain for several years. She states that most recently she's had some epigastric abdominal pain that radiates to the mid back. She states that this feels knifelike in nature. She states this is associated with eating higher fatty foods. She states that she also has some nausea with these symptoms. She states that recently she has been sent away for many high-fat foods and fit on a bland diet.  Patient underwent ultrasound which revealed cholelithiasis without any signs of cholecystitis. I did review this personally.    Diagnostic Studies History Erline Levine, RN; 03/25/2018 9:37 AM) Colonoscopy  5-10 years ago Mammogram  within last year Pap Smear  1-5 years ago  Allergies Erline Levine, RN; 03/25/2018 9:37 AM) No Known Drug Allergies [03/25/2018]: Allergies Reconciled   Medication History Erline Levine, RN; 03/25/2018 9:39 AM) Lipitor (40MG  Tablet, Oral) Active. MetFORMIN HCl (1000MG  Tablet, Oral) Active. Synthroid (75MCG Tablet, Oral) Active. Zantac (150MG /6ML Solution, Injection) Active. PriLOSEC OTC (20MG  Tablet DR, Oral) Active. Mobic (7.5MG  Tablet, Oral) Active. Medications Reconciled  Social History Erline Levine, RN; 03/25/2018 9:37 AM) Alcohol use  Occasional alcohol use. Caffeine use  Carbonated beverages, Coffee, Tea. No drug use  Tobacco use  Never smoker.  Family History Erline Levine, RN; 03/25/2018 9:37 AM) Family history unknown  First Degree Relatives   Pregnancy / Birth History Erline Levine, RN; 03/25/2018 9:37 AM) Age at menarche  16 years. Age of menopause  73-50 Gravida   3 Irregular periods  Length (months) of breastfeeding  7-12 Maternal age  56-20 Para  3  Other Problems Erline Levine, RN; 03/25/2018 9:37 AM) Back Pain  Bladder Problems  Hemorrhoids     Review of Systems Ralene Ok MD; 03/25/2018 9:47 AM) General Present- Fatigue. Not Present- Appetite Loss, Chills, Fever, Night Sweats, Weight Gain and Weight Loss. Skin Not Present- Change in Wart/Mole, Dryness, Hives, Jaundice, New Lesions, Non-Healing Wounds, Rash and Ulcer. Respiratory Not Present- Cough and Difficulty Breathing. Breast Not Present- Breast Mass, Breast Pain, Nipple Discharge and Skin Changes. Cardiovascular Not Present- Chest Pain, Difficulty Breathing Lying Down, Leg Cramps, Palpitations, Rapid Heart Rate, Shortness of Breath and Swelling of Extremities. Gastrointestinal Present- Abdominal Pain and Gets full quickly at meals. Not Present- Bloating, Bloody Stool, Change in Bowel Habits, Chronic diarrhea, Constipation, Difficulty Swallowing, Excessive gas, Hemorrhoids, Indigestion, Nausea, Rectal Pain and Vomiting. Female Genitourinary Present- Painful Urination. Not Present- Frequency, Nocturia, Pelvic Pain and Urgency. Musculoskeletal Present- Back Pain and Muscle Pain. Not Present- Joint Pain, Joint Stiffness, Muscle Weakness and Swelling of Extremities. Neurological Not Present- Decreased Memory, Fainting, Headaches, Numbness, Seizures, Tingling, Tremor, Trouble walking and Weakness. Psychiatric Not Present- Anxiety, Bipolar, Change in Sleep Pattern, Depression, Fearful and Frequent crying. Endocrine Not Present- Cold Intolerance, Excessive Hunger, Hair Changes, Heat Intolerance, Hot flashes and New Diabetes. All other systems negative  Vitals Erline Levine RN; 03/25/2018 9:40 AM) 03/25/2018 9:39 AM Weight: 169.5 lb Height: 64in Body Surface Area: 1.82 m Body Mass Index: 29.09 kg/m  Temp.: 98.2F(Oral)  Pulse: 70 (Regular)  P.OX: 99% (Room air) BP:  124/76 (Sitting, Left Arm, Standard)       Physical Exam Ralene Ok MD; 03/25/2018 9:49 AM) The physical exam findings are as  follows: Note:Constitutional: No acute distress, conversant, appears stated age  Eyes: Anicteric sclerae, moist conjunctiva, no lid lag  Neck: No thyromegaly, trachea midline, no cervical lymphadenopathy  Lungs: Clear to auscultation biilaterally, normal respiratory effot  Cardiovascular: regular rate & rhythm, no murmurs, no peripheal edema, pedal pulses 2+  GI: Soft, no masses or hepatosplenomegaly, non-tender to palpation  MSK: Normal gait, no clubbing cyanosis, edema  Skin: No rashes, palpation reveals normal skin turgor  Psychiatric: Appropriate judgment and insight, oriented to person, place, and time    Assessment & Plan Ralene Ok MD; 03/25/2018 9:49 AM) SYMPTOMATIC CHOLELITHIASIS (K80.20) Impression: 57 year old female with symptomatic cholelithiasis  1. We will proceed to the operating room for a laparoscopic cholecystectomy with IOC  2. Risks and benefits were discussed with the patient to generally include, but not limited to: infection, bleeding, possible need for post op ERCP, damage to the bile ducts, bile leak, and possible need for further surgery. Alternatives were offered and described. All questions were answered and the patient voiced understanding of the procedure and wishes to proceed at this point with a laparoscopic cholecystectomy

## 2018-03-30 ENCOUNTER — Other Ambulatory Visit: Payer: Self-pay

## 2018-03-30 ENCOUNTER — Encounter (HOSPITAL_COMMUNITY): Payer: Self-pay | Admitting: *Deleted

## 2018-03-30 NOTE — Progress Notes (Signed)
Spoke with pt's husband, Heliodoro for pre-op call via [employer] Joe [phone]. He states pt doesn not have a cardiac history. Pt is diabetic, type 2. He states she does not check her blood sugar at home. Pt's most recent A1C was 7.4 on 01/27/18.

## 2018-03-31 ENCOUNTER — Encounter: Payer: Self-pay | Admitting: Internal Medicine

## 2018-03-31 ENCOUNTER — Ambulatory Visit (HOSPITAL_COMMUNITY): Payer: BLUE CROSS/BLUE SHIELD

## 2018-03-31 ENCOUNTER — Other Ambulatory Visit: Payer: Self-pay

## 2018-03-31 ENCOUNTER — Ambulatory Visit (HOSPITAL_COMMUNITY): Payer: BLUE CROSS/BLUE SHIELD | Admitting: Anesthesiology

## 2018-03-31 ENCOUNTER — Encounter (HOSPITAL_COMMUNITY)
Admission: RE | Disposition: A | Discharge status: Home / Self Care | Payer: Self-pay | Source: Ambulatory Visit | Attending: General Surgery

## 2018-03-31 ENCOUNTER — Ambulatory Visit (HOSPITAL_COMMUNITY)
Admission: RE | Admit: 2018-03-31 | Discharge: 2018-03-31 | Disposition: A | Discharge status: Home / Self Care | Payer: BLUE CROSS/BLUE SHIELD | Source: Ambulatory Visit | Attending: General Surgery | Admitting: General Surgery

## 2018-03-31 ENCOUNTER — Encounter (HOSPITAL_COMMUNITY): Payer: Self-pay | Admitting: *Deleted

## 2018-03-31 DIAGNOSIS — E785 Hyperlipidemia, unspecified: Secondary | ICD-10-CM | POA: Diagnosis not present

## 2018-03-31 DIAGNOSIS — E1140 Type 2 diabetes mellitus with diabetic neuropathy, unspecified: Secondary | ICD-10-CM | POA: Diagnosis not present

## 2018-03-31 DIAGNOSIS — E119 Type 2 diabetes mellitus without complications: Secondary | ICD-10-CM | POA: Insufficient documentation

## 2018-03-31 DIAGNOSIS — Z79890 Hormone replacement therapy (postmenopausal): Secondary | ICD-10-CM | POA: Insufficient documentation

## 2018-03-31 DIAGNOSIS — K8010 Calculus of gallbladder with chronic cholecystitis without obstruction: Secondary | ICD-10-CM | POA: Insufficient documentation

## 2018-03-31 DIAGNOSIS — Z79899 Other long term (current) drug therapy: Secondary | ICD-10-CM | POA: Insufficient documentation

## 2018-03-31 DIAGNOSIS — Z791 Long term (current) use of non-steroidal anti-inflammatories (NSAID): Secondary | ICD-10-CM | POA: Diagnosis not present

## 2018-03-31 DIAGNOSIS — K8020 Calculus of gallbladder without cholecystitis without obstruction: Secondary | ICD-10-CM | POA: Diagnosis not present

## 2018-03-31 DIAGNOSIS — E039 Hypothyroidism, unspecified: Secondary | ICD-10-CM | POA: Diagnosis not present

## 2018-03-31 DIAGNOSIS — K219 Gastro-esophageal reflux disease without esophagitis: Secondary | ICD-10-CM | POA: Diagnosis not present

## 2018-03-31 DIAGNOSIS — K760 Fatty (change of) liver, not elsewhere classified: Secondary | ICD-10-CM | POA: Diagnosis not present

## 2018-03-31 DIAGNOSIS — Z7984 Long term (current) use of oral hypoglycemic drugs: Secondary | ICD-10-CM | POA: Insufficient documentation

## 2018-03-31 DIAGNOSIS — R1013 Epigastric pain: Secondary | ICD-10-CM | POA: Diagnosis not present

## 2018-03-31 DIAGNOSIS — Z419 Encounter for procedure for purposes other than remedying health state, unspecified: Secondary | ICD-10-CM

## 2018-03-31 HISTORY — DX: Anemia, unspecified: D64.9

## 2018-03-31 HISTORY — DX: Hypothyroidism, unspecified: E03.9

## 2018-03-31 HISTORY — DX: Type 2 diabetes mellitus without complications: E11.9

## 2018-03-31 HISTORY — PX: CHOLECYSTECTOMY: SHX55

## 2018-03-31 LAB — BASIC METABOLIC PANEL
ANION GAP: 11 (ref 5–15)
BUN: 9 mg/dL (ref 6–20)
CALCIUM: 9.3 mg/dL (ref 8.9–10.3)
CO2: 19 mmol/L — ABNORMAL LOW (ref 22–32)
Chloride: 108 mmol/L (ref 101–111)
Creatinine, Ser: 0.56 mg/dL (ref 0.44–1.00)
GFR calc Af Amer: >60 mL/min (ref >60)
GLUCOSE: 110 mg/dL — AB (ref 65–99)
POTASSIUM: 3.9 mmol/L (ref 3.5–5.1)
SODIUM: 138 mmol/L (ref 135–145)

## 2018-03-31 LAB — GLUCOSE, CAPILLARY
GLUCOSE-CAPILLARY: 109 mg/dL — AB (ref 65–99)
Glucose-Capillary: 161 mg/dL — ABNORMAL HIGH (ref 65–99)

## 2018-03-31 LAB — CBC
HCT: 39.3 % (ref 36.0–46.0)
Hemoglobin: 13.4 g/dL (ref 12.0–15.0)
MCH: 31.6 pg (ref 26.0–34.0)
MCHC: 34.1 g/dL (ref 30.0–36.0)
MCV: 92.7 fL (ref 78.0–100.0)
Platelets: 249 10*3/uL (ref 150–400)
RBC: 4.24 MIL/uL (ref 3.87–5.11)
RDW: 12.3 % (ref 11.5–15.5)
WBC: 5.2 10*3/uL (ref 4.0–10.5)

## 2018-03-31 LAB — HEMOGLOBIN A1C
HEMOGLOBIN A1C: 6.7 % — AB (ref 4.8–5.6)
Mean Plasma Glucose: 145.59 mg/dL

## 2018-03-31 SURGERY — LAPAROSCOPIC CHOLECYSTECTOMY WITH INTRAOPERATIVE CHOLANGIOGRAM
Anesthesia: General | Site: Abdomen

## 2018-03-31 MED ORDER — SODIUM CHLORIDE 0.9 % IR SOLN
Status: DC | PRN
  Administered 2018-03-31: 1000 mL

## 2018-03-31 MED ORDER — IOPAMIDOL (ISOVUE-300) INJECTION 61%
INTRAVENOUS | Status: AC
  Filled 2018-03-31: qty 50

## 2018-03-31 MED ORDER — ONDANSETRON HCL 4 MG/2ML IJ SOLN: 4.0000 mg | Freq: Once | INTRAMUSCULAR | Status: DC | PRN

## 2018-03-31 MED ORDER — SODIUM CHLORIDE 0.9 % IV SOLN
INTRAVENOUS | Status: DC | PRN
  Administered 2018-03-31: 11 mL

## 2018-03-31 MED ORDER — CHLORHEXIDINE GLUCONATE CLOTH 2 % EX PADS: 6.0000 | MEDICATED_PAD | Freq: Once | CUTANEOUS | Status: DC

## 2018-03-31 MED ORDER — LIDOCAINE HCL (CARDIAC) 20 MG/ML IV SOLN
INTRAVENOUS | Status: DC | PRN
  Administered 2018-03-31: 40 mg via INTRAVENOUS

## 2018-03-31 MED ORDER — SUGAMMADEX SODIUM 200 MG/2ML IV SOLN
INTRAVENOUS | Status: DC | PRN
  Administered 2018-03-31: 200 mg via INTRAVENOUS

## 2018-03-31 MED ORDER — TRAMADOL HCL 50 MG PO TABS: 50.0000 mg | ORAL_TABLET | Freq: Four times a day (QID) | ORAL | 0 refills | Status: DC | PRN

## 2018-03-31 MED ORDER — OXYCODONE HCL 5 MG PO TABS
ORAL_TABLET | ORAL | Status: DC
Start: 2018-03-31 — End: 2018-03-31
  Filled 2018-03-31: qty 1

## 2018-03-31 MED ORDER — CEFAZOLIN SODIUM-DEXTROSE 2-4 GM/100ML-% IV SOLN
2.0000 g | INTRAVENOUS | Status: AC
  Administered 2018-03-31: 2 g via INTRAVENOUS
  Filled 2018-03-31: qty 100

## 2018-03-31 MED ORDER — BUPIVACAINE-EPINEPHRINE (PF) 0.25% -1:200000 IJ SOLN
INTRAMUSCULAR | Status: AC
  Filled 2018-03-31: qty 30

## 2018-03-31 MED ORDER — CELECOXIB 200 MG PO CAPS
200.0000 mg | ORAL_CAPSULE | ORAL | Status: AC
  Administered 2018-03-31: 200 mg via ORAL
  Filled 2018-03-31: qty 1

## 2018-03-31 MED ORDER — PROPOFOL 10 MG/ML IV BOLUS
INTRAVENOUS | Status: DC | PRN
  Administered 2018-03-31: 150 mg via INTRAVENOUS

## 2018-03-31 MED ORDER — GABAPENTIN 300 MG PO CAPS
300.0000 mg | ORAL_CAPSULE | ORAL | Status: AC
  Administered 2018-03-31: 300 mg via ORAL
  Filled 2018-03-31: qty 1

## 2018-03-31 MED ORDER — MIDAZOLAM HCL 5 MG/5ML IJ SOLN
INTRAMUSCULAR | Status: DC | PRN
  Administered 2018-03-31: 2 mg via INTRAVENOUS

## 2018-03-31 MED ORDER — ONDANSETRON HCL 4 MG/2ML IJ SOLN
INTRAMUSCULAR | Status: DC | PRN
  Administered 2018-03-31: 4 mg via INTRAVENOUS

## 2018-03-31 MED ORDER — BUPIVACAINE-EPINEPHRINE (PF) 0.25% -1:200000 IJ SOLN
INTRAMUSCULAR | Status: DC | PRN
  Administered 2018-03-31: 10 mL via PERINEURAL

## 2018-03-31 MED ORDER — FENTANYL CITRATE (PF) 100 MCG/2ML IJ SOLN
25.0000 ug | INTRAMUSCULAR | Status: DC | PRN
  Administered 2018-03-31 (×3): 50 ug via INTRAVENOUS

## 2018-03-31 MED ORDER — LACTATED RINGERS IV SOLN
INTRAVENOUS | Status: DC
  Administered 2018-03-31 (×2): via INTRAVENOUS

## 2018-03-31 MED ORDER — FENTANYL CITRATE (PF) 100 MCG/2ML IJ SOLN
INTRAMUSCULAR | Status: AC
  Administered 2018-03-31: 50 ug via INTRAVENOUS
  Filled 2018-03-31: qty 2

## 2018-03-31 MED ORDER — FENTANYL CITRATE (PF) 100 MCG/2ML IJ SOLN
INTRAMUSCULAR | Status: DC | PRN
  Administered 2018-03-31: 100 ug via INTRAVENOUS

## 2018-03-31 MED ORDER — ACETAMINOPHEN 500 MG PO TABS
1000.0000 mg | ORAL_TABLET | ORAL | Status: AC
  Administered 2018-03-31: 1000 mg via ORAL
  Filled 2018-03-31: qty 2

## 2018-03-31 MED ORDER — FENTANYL CITRATE (PF) 100 MCG/2ML IJ SOLN
INTRAMUSCULAR | Status: AC
Start: 2018-03-31 — End: 2018-03-31
  Administered 2018-03-31: 50 ug via INTRAVENOUS
  Filled 2018-03-31: qty 2

## 2018-03-31 MED ORDER — DEXAMETHASONE SODIUM PHOSPHATE 10 MG/ML IJ SOLN
INTRAMUSCULAR | Status: DC | PRN
  Administered 2018-03-31: 5 mg via INTRAVENOUS

## 2018-03-31 MED ORDER — PROPOFOL 10 MG/ML IV BOLUS
INTRAVENOUS | Status: AC
  Filled 2018-03-31: qty 20

## 2018-03-31 MED ORDER — OXYCODONE HCL 5 MG PO TABS
5.0000 mg | ORAL_TABLET | Freq: Once | ORAL | Status: AC | PRN
  Administered 2018-03-31: 5 mg via ORAL

## 2018-03-31 MED ORDER — ROCURONIUM BROMIDE 100 MG/10ML IV SOLN
INTRAVENOUS | Status: DC | PRN
  Administered 2018-03-31: 50 mg via INTRAVENOUS

## 2018-03-31 MED ORDER — FENTANYL CITRATE (PF) 250 MCG/5ML IJ SOLN
INTRAMUSCULAR | Status: AC
  Filled 2018-03-31: qty 5

## 2018-03-31 MED ORDER — OXYCODONE HCL 5 MG/5ML PO SOLN: 5.0000 mg | Freq: Once | ORAL | Status: AC | PRN

## 2018-03-31 MED ORDER — MIDAZOLAM HCL 2 MG/2ML IJ SOLN
INTRAMUSCULAR | Status: AC
  Filled 2018-03-31: qty 2

## 2018-03-31 MED ORDER — 0.9 % SODIUM CHLORIDE (POUR BTL) OPTIME
TOPICAL | Status: DC | PRN
  Administered 2018-03-31: 1000 mL

## 2018-03-31 SURGICAL SUPPLY — 41 items
APPLIER CLIP 5 13 M/L LIGAMAX5 (MISCELLANEOUS)
CANISTER SUCT 3000ML PPV (MISCELLANEOUS) ×3 IMPLANT
CHLORAPREP W/TINT 26ML (MISCELLANEOUS) ×3 IMPLANT
CLIP APPLIE 5 13 M/L LIGAMAX5 (MISCELLANEOUS) IMPLANT
CLIP VESOLOCK MED LG 6/CT (CLIP) ×3 IMPLANT
COVER MAYO STAND STRL (DRAPES) ×3 IMPLANT
COVER SURGICAL LIGHT HANDLE (MISCELLANEOUS) ×3 IMPLANT
COVER TRANSDUCER ULTRASND (DRAPES) ×3 IMPLANT
DERMABOND ADVANCED (GAUZE/BANDAGES/DRESSINGS) ×2
DERMABOND ADVANCED .7 DNX12 (GAUZE/BANDAGES/DRESSINGS) ×1 IMPLANT
DRAPE C-ARM 42X72 X-RAY (DRAPES) ×3 IMPLANT
ELECT REM PT RETURN 9FT ADLT (ELECTROSURGICAL) ×3
ELECTRODE REM PT RTRN 9FT ADLT (ELECTROSURGICAL) ×1 IMPLANT
GLOVE BIO SURGEON STRL SZ7.5 (GLOVE) ×3 IMPLANT
GOWN STRL REUS W/ TWL LRG LVL3 (GOWN DISPOSABLE) ×2 IMPLANT
GOWN STRL REUS W/ TWL XL LVL3 (GOWN DISPOSABLE) ×1 IMPLANT
GOWN STRL REUS W/TWL LRG LVL3 (GOWN DISPOSABLE) ×4
GOWN STRL REUS W/TWL XL LVL3 (GOWN DISPOSABLE) ×2
GRASPER SUT TROCAR 14GX15 (MISCELLANEOUS) ×3 IMPLANT
IV CATH 14GX2 1/4 (CATHETERS) ×3 IMPLANT
KIT BASIN OR (CUSTOM PROCEDURE TRAY) ×3 IMPLANT
KIT TURNOVER KIT B (KITS) ×3 IMPLANT
NEEDLE INSUFFLATION 14GA 120MM (NEEDLE) ×3 IMPLANT
NS IRRIG 1000ML POUR BTL (IV SOLUTION) ×3 IMPLANT
PAD ARMBOARD 7.5X6 YLW CONV (MISCELLANEOUS) ×6 IMPLANT
POUCH RETRIEVAL ECOSAC 10 (ENDOMECHANICALS) IMPLANT
POUCH RETRIEVAL ECOSAC 10MM (ENDOMECHANICALS)
SCISSORS LAP 5X35 DISP (ENDOMECHANICALS) ×3 IMPLANT
SET CHOLANGIOGRAPHY FRANKLIN (SET/KITS/TRAYS/PACK) ×3 IMPLANT
SET IRRIG TUBING LAPAROSCOPIC (IRRIGATION / IRRIGATOR) ×3 IMPLANT
SLEEVE ENDOPATH XCEL 5M (ENDOMECHANICALS) ×3 IMPLANT
SPECIMEN JAR SMALL (MISCELLANEOUS) ×3 IMPLANT
STOPCOCK 4 WAY LG BORE MALE ST (IV SETS) ×3 IMPLANT
SUT MNCRL AB 4-0 PS2 18 (SUTURE) ×3 IMPLANT
TOWEL OR 17X24 6PK STRL BLUE (TOWEL DISPOSABLE) ×3 IMPLANT
TOWEL OR 17X26 10 PK STRL BLUE (TOWEL DISPOSABLE) ×3 IMPLANT
TRAY LAPAROSCOPIC MC (CUSTOM PROCEDURE TRAY) ×3 IMPLANT
TROCAR XCEL NON-BLD 11X100MML (ENDOMECHANICALS) ×3 IMPLANT
TROCAR XCEL NON-BLD 5MMX100MML (ENDOMECHANICALS) ×3 IMPLANT
TUBING INSUFFLATION (TUBING) ×3 IMPLANT
WATER STERILE IRR 1000ML POUR (IV SOLUTION) ×3 IMPLANT

## 2018-03-31 NOTE — Discharge Instructions (Signed)
[facility] ______[facility] °LAPAROSCOPIC SURGERY: POST OP INSTRUCTIONS °Always review your discharge instruction sheet given to you by the facility where your surgery was performed. °IF YOU HAVE DISABILITY OR FAMILY LEAVE FORMS, YOU MUST BRING THEM TO THE OFFICE FOR PROCESSING.   °DO NOT GIVE THEM TO YOUR DOCTOR. ° °1. A prescription for pain medication may be given to you upon discharge.  Take your pain medication as prescribed, if needed.  If narcotic pain medicine is not needed, then you may take acetaminophen (Tylenol) or ibuprofen (Advil) as needed. °2. Take your usually prescribed medications unless otherwise directed. °3. If you need a refill on your pain medication, please contact your pharmacy.  They will contact our office to request authorization. Prescriptions will not be filled after 5pm or on week-ends. °4. You should follow a light diet the first few days after arrival home, such as soup and crackers, etc.  Be sure to include lots of fluids daily. °5. Most patients will experience some swelling and bruising in the area of the incisions.  Ice packs will help.  Swelling and bruising can take several days to resolve.  °6. It is common to experience some constipation if taking pain medication after surgery.  Increasing fluid intake and taking a stool softener (such as Colace) will usually help or prevent this problem from occurring.  A mild laxative (Milk of Magnesia or Miralax) should be taken according to package instructions if there are no bowel movements after 48 hours. °7. Unless discharge instructions indicate otherwise, you may remove your bandages 24-48 hours after surgery, and you may shower at that time.  You may have steri-strips (small skin tapes) in place directly over the incision.  These strips should be left on the skin for 7-10 days.  If your surgeon used skin glue on the incision, you may shower in 24 hours.  The glue will flake off over the next 2-3 weeks.  Any sutures or  staples will be removed at the office during your follow-up visit. °8. ACTIVITIES:  You may resume regular (light) daily activities beginning the next day--such as daily self-care, walking, climbing stairs--gradually increasing activities as tolerated.  You may have sexual intercourse when it is comfortable.  Refrain from any heavy lifting or straining until approved by your doctor. °a. You may drive when you are no longer taking prescription pain medication, you can comfortably wear a seatbelt, and you can safely maneuver your car and apply brakes. °b. RETURN TO WORK:  __________________________________________________________ °9. You should see your doctor in the office for a follow-up appointment approximately 2-3 weeks after your surgery.  Make sure that you call for this appointment within a day or two after you arrive home to insure a convenient appointment time. °10. OTHER INSTRUCTIONS: __________________________________________________________________________________________________________________________ __________________________________________________________________________________________________________________________ °WHEN TO CALL YOUR DOCTOR: °1. Fever over 101.0 °2. Inability to urinate °3. Continued bleeding from incision. °4. Increased pain, redness, or drainage from the incision. °5. Increasing abdominal pain ° °The clinic staff is available to answer your questions during regular business hours.  Please don’t hesitate to call and ask to speak to one of the nurses for clinical concerns.  If you have a medical emergency, go to the nearest emergency room or call 911.  A surgeon from [facility] is always on call at the hospital. °[address], [address], [place], [place]  [address] ? [address] [address], [place], [place]   [address] °[phone] ? [phone] ? FAX [phone] °Web site:   [email or web address] °

## 2018-03-31 NOTE — Transfer of Care (Signed)
Immediate Anesthesia Transfer of Care Note  Patient: Bailey Harris  Procedure(s) Performed: LAPAROSCOPIC CHOLECYSTECTOMY WITH INTRAOPERATIVE CHOLANGIOGRAM (N/A Abdomen)  Patient Location: PACU  Anesthesia Type:General  Level of Consciousness: awake, alert  and patient cooperative  Airway & Oxygen Therapy: Patient Spontanous Breathing  Post-op Assessment: Report given to RN and Post -op Vital signs reviewed and stable  Post vital signs: Reviewed and stable  Last Vitals:  Vitals Value Taken Time  BP 119/63 03/31/2018  1:45 PM  Temp 36.6 C 03/31/2018  1:44 PM  Pulse 77 03/31/2018  1:45 PM  Resp 12 03/31/2018  1:45 PM  SpO2 99 % 03/31/2018  1:45 PM  Vitals shown include unvalidated device data.  Last Pain:  Vitals:   03/31/18 1344  TempSrc:   PainSc: 0-No pain      Patients Stated Pain Goal: 3 (88/75/79 [id number])  Complications: No apparent anesthesia complications

## 2018-03-31 NOTE — Interval H&P Note (Signed)
History and Physical Interval Note:  03/31/2018 12:20 PM  Bailey Harris  has presented today for surgery, with the diagnosis of GALLSTONES  The various methods of treatment have been discussed with the patient and family. After consideration of risks, benefits and other options for treatment, the patient has consented to  Procedure(s): LAPAROSCOPIC CHOLECYSTECTOMY WITH INTRAOPERATIVE CHOLANGIOGRAM (N/A) as a surgical intervention .  The patient's history has been reviewed, patient examined, no change in status, stable for surgery.  I have reviewed the patient's chart and labs.  Questions were answered to the patient's satisfaction.     Rosario Jacks., Anne Hahn

## 2018-03-31 NOTE — Anesthesia Procedure Notes (Signed)
Procedure Name: Intubation Date/Time: 03/31/2018 12:50 PM Performed by: Babs Bertin, CRNA Pre-anesthesia Checklist: Patient identified, Emergency Drugs available, Suction available and Patient being monitored Patient Re-evaluated:Patient Re-evaluated prior to induction Oxygen Delivery Method: Circle System Utilized Preoxygenation: Pre-oxygenation with 100% oxygen Induction Type: IV induction Ventilation: Mask ventilation without difficulty Laryngoscope Size: Mac and 3 Grade View: Grade I Tube type: Oral Tube size: 7.0 mm Number of attempts: 1 Airway Equipment and Method: Stylet and Oral airway Placement Confirmation: ETT inserted through vocal cords under direct vision,  positive ETCO2 and breath sounds checked- equal and bilateral Secured at: 21 cm Tube secured with: Tape Dental Injury: Teeth and Oropharynx as per pre-operative assessment

## 2018-03-31 NOTE — Anesthesia Preprocedure Evaluation (Signed)
Anesthesia Evaluation  Patient identified by MRN, date of birth, ID band Patient awake    Reviewed: Allergy & Precautions, NPO status , Patient's Chart, lab work & pertinent test results  Airway Mallampati: II  TM Distance: >3 FB Neck ROM: Full    Dental  (+) Partial Lower, Partial Upper   Pulmonary    breath sounds clear to auscultation       Cardiovascular  Rhythm:Regular Rate:Normal     Neuro/Psych    GI/Hepatic   Endo/Other  diabetes  Renal/GU      Musculoskeletal   Abdominal   Peds  Hematology   Anesthesia Other Findings   Reproductive/Obstetrics                             Anesthesia Physical Anesthesia Plan  ASA: II  Anesthesia Plan: General   Post-op Pain Management:    Induction: Intravenous  PONV Risk Score and Plan: 1 and Ondansetron and Dexamethasone  Airway Management Planned: Oral ETT  Additional Equipment:   Intra-op Plan:   Post-operative Plan: Extubation in OR  Informed Consent: I have reviewed the patients History and Physical, chart, labs and discussed the procedure including the risks, benefits and alternatives for the proposed anesthesia with the patient or authorized representative who has indicated his/her understanding and acceptance.   Dental advisory given  Plan Discussed with: CRNA and Anesthesiologist  Anesthesia Plan Comments:         Anesthesia Quick Evaluation

## 2018-03-31 NOTE — Anesthesia Postprocedure Evaluation (Signed)
Anesthesia Post Note  Patient: Bailey Harris  Procedure(s) Performed: LAPAROSCOPIC CHOLECYSTECTOMY WITH INTRAOPERATIVE CHOLANGIOGRAM (N/A Abdomen)     Patient location during evaluation: PACU Anesthesia Type: General Level of consciousness: awake and alert Pain management: pain level controlled Vital Signs Assessment: post-procedure vital signs reviewed and stable Respiratory status: spontaneous breathing, nonlabored ventilation, respiratory function stable and patient connected to nasal cannula oxygen Cardiovascular status: blood pressure returned to baseline and stable Postop Assessment: no apparent nausea or vomiting Anesthetic complications: no    Last Vitals:  Vitals:   03/31/18 1445 03/31/18 1455  BP: 105/71   Pulse: (!) 56 (!) 57  Resp: (!) 9 11  Temp:    SpO2: 99% 96%    Last Pain:  Vitals:   03/31/18 1430  TempSrc:   PainSc: Asleep                 Dashanique Brownstein S

## 2018-03-31 NOTE — Op Note (Signed)
03/31/2018  1:31 PM  PATIENT:  Bailey Harris  57 y.o. female  PRE-OPERATIVE DIAGNOSIS:  GALLSTONES  POST-OPERATIVE DIAGNOSIS:  GALLSTONES  PROCEDURE:  Procedure(s): LAPAROSCOPIC CHOLECYSTECTOMY WITH INTRAOPERATIVE CHOLANGIOGRAM (N/A)  SURGEON:  Surgeon(s) and Role:    * Ralene Ok, MD - Primary  ANESTHESIA:   local and general  EBL:  minimal   BLOOD ADMINISTERED:none  DRAINS: none   LOCAL MEDICATIONS USED:  BUPIVICAINE   SPECIMEN:  Source of Specimen:  gallbladder  DISPOSITION OF SPECIMEN:  PATHOLOGY  COUNTS:  YES  TOURNIQUET:  * No tourniquets in log *  DICTATION: .Dragon Dictation The patient was taken to the operating and placed in the supine position with bilateral SCDs in place. The patient was prepped and draped in the usual sterile fashion. A time out was called and all facts were verified. A pneumoperitoneum was obtained via A Veress needle technique to a pressure of 69mm of mercury.  A 24mm trochar was then placed in the right upper quadrant under visualization, and there were no injuries to any abdominal organs. A 11 mm port was then placed in the umbilical region after infiltrating with local anesthesia under direct visualization. A second  epigastric port under direct visualization.    The gallbladder was identified and retracted, the peritoneum was then sharply dissected from the gallbladder and this dissection was carried down to Calot's triangle. The gallbladder was identified and stripped away circumferentially and seen going into the gallbladder 360, the critical angle was obtained. A Cook catheter was used to perform an intraoperative cholangiogram. The cholangiogram showed no filling defects and the contrast emptied into the duodenum easily.  The hepatic ducts were seen to be free of any filling defects. 2 clips were placed proximally one distally and the cystic duct transected. The cystic artery was identified and 2 clips placed proximally and one  distally and transected.   We then proceeded to remove the gallbladder off the hepatic fossa with Bovie cautery. A retrieval bag was then placed in the abdomen and gallbladder placed in the bag. The hepatic fossa was then reexamined and hemostasis was achieved with Bovie cautery and was excellent at the end of the case. The subhepatic fossa and perihepatic fossa was then irrigated until the effluent was clear. The 11 mm trocar fascia was reapproximated with the PMI & a #1 Vicryl x2. The pneumoperitoneum was evacuated and all trochars removed under direct visulalization. The skin was then closed with 4-0 Monocryl and the skin dressed with Dermabond. The patient was awaken from general anesthesia and taken to the recovery room in stable condition.     PLAN OF CARE: Discharge to home after PACU  PATIENT DISPOSITION:  PACU - hemodynamically stable.   Delay start of Pharmacological VTE agent (>24hrs) due to surgical blood loss or risk of bleeding: not applicable

## 2018-04-01 ENCOUNTER — Encounter (HOSPITAL_COMMUNITY): Payer: Self-pay | Admitting: General Surgery

## 2018-04-01 ENCOUNTER — Telehealth: Payer: Self-pay | Admitting: *Deleted

## 2018-04-01 ENCOUNTER — Encounter: Payer: Self-pay | Admitting: Internal Medicine

## 2018-04-01 NOTE — Telephone Encounter (Signed)
Dr Henrene Pastor,  This pt saw you [phone] and you ordered an ECL. The husband called and said now they only want to do the colon as the EGD will not be covered by insurance. The colon has been scheduled for 5-2 with you for a hx of colon polyps. The pt had her gall bladder removed yesterday , 4-2.  Is she ok for a colon 5-2?  Please advise, Thanks, Lelan Pons

## 2018-04-01 NOTE — Telephone Encounter (Signed)
Thanks!!  Will proceed as scheduled.  Marie PV 

## 2018-04-01 NOTE — Telephone Encounter (Signed)
Yes

## 2018-04-08 ENCOUNTER — Encounter: Payer: BLUE CROSS/BLUE SHIELD | Admitting: Internal Medicine

## 2018-04-09 ENCOUNTER — Other Ambulatory Visit: Payer: Self-pay

## 2018-04-09 ENCOUNTER — Ambulatory Visit (AMBULATORY_SURGERY_CENTER): Payer: Self-pay | Admitting: *Deleted

## 2018-04-09 VITALS — Ht 64.0 in | Wt 165.8 lb

## 2018-04-09 DIAGNOSIS — Z86010 Personal history of colonic polyps: Secondary | ICD-10-CM

## 2018-04-09 MED ORDER — NA SULFATE-K SULFATE-MG SULF 17.5-3.13-1.6 GM/177ML PO SOLN: 1.0000 | Freq: Once | ORAL | 0 refills | Status: AC

## 2018-04-09 NOTE — Progress Notes (Signed)
Denies allergies to eggs or soy products. Denies complications with sedation or anesthesia. Denies O2 use. Denies use of diet or weight loss medications.  Emmi instructions not given for colonoscopy, spanish speaking.  Patient had cholecystectomy 03/31/18. Dr Henrene Pastor off today, Dr. Hilarie Fredrickson doc of the day, he said okay to proceed with colonoscopy 04/30/18.  [place], [place] interpreter.

## 2018-04-14 ENCOUNTER — Other Ambulatory Visit: Payer: Self-pay | Admitting: Medical

## 2018-04-16 ENCOUNTER — Encounter: Payer: Self-pay | Admitting: Internal Medicine

## 2018-04-19 ENCOUNTER — Other Ambulatory Visit: Payer: Self-pay | Admitting: Medical

## 2018-04-30 ENCOUNTER — Encounter: Payer: Self-pay | Admitting: Internal Medicine

## 2018-04-30 ENCOUNTER — Ambulatory Visit (AMBULATORY_SURGERY_CENTER): Payer: BLUE CROSS/BLUE SHIELD | Admitting: Internal Medicine

## 2018-04-30 ENCOUNTER — Other Ambulatory Visit: Payer: Self-pay

## 2018-04-30 VITALS — BP 104/55 | HR 67 | Temp 98.2°F | Resp 12 | Ht 62.0 in | Wt 169.0 lb

## 2018-04-30 DIAGNOSIS — D122 Benign neoplasm of ascending colon: Secondary | ICD-10-CM | POA: Diagnosis not present

## 2018-04-30 DIAGNOSIS — Z86010 Personal history of colonic polyps: Secondary | ICD-10-CM

## 2018-04-30 MED ORDER — SODIUM CHLORIDE 0.9 % IV SOLN: 500.0000 mL | Freq: Once | INTRAVENOUS | Status: DC

## 2018-04-30 NOTE — Progress Notes (Signed)
Called to room to assist during endoscopic procedure.  Patient ID and intended procedure confirmed with present staff. Received instructions for my participation in the procedure from the performing physician.  

## 2018-04-30 NOTE — Op Note (Signed)
[facility] Patient Name: Lenah Messenger Procedure Date: 04/30/2018 1:04 PM MRN: [id number] Endoscopist: Docia Chuck. Henrene Pastor , MD Age: 57 Referring MD:  Date of Birth: [birth year] Gender: Female Account #: [id number] Procedure:                Colonoscopy, With cold snare polypectomy Indications:              High risk colon cancer surveillance: Personal                            history of non-advanced adenoma. Elsewhere 2011 Medicines:                Monitored Anesthesia Care Procedure:                Pre-Anesthesia Assessment:                           - Prior to the procedure, a History and Physical                            was performed, and patient medications and                            allergies were reviewed. The patient's tolerance of                            previous anesthesia was also reviewed. The risks                            and benefits of the procedure and the sedation                            options and risks were discussed with the patient.                            All questions were answered, and informed consent                            was obtained. Prior Anticoagulants: The patient has                            taken no previous anticoagulant or antiplatelet                            agents. ASA Grade Assessment: II - A patient with                            mild systemic disease. After reviewing the risks                            and benefits, the patient was deemed in                            satisfactory condition to undergo the procedure.  After obtaining informed consent, the colonoscope                            was passed under direct vision. Throughout the                            procedure, the patient's blood pressure, pulse, and                            oxygen saturations were monitored continuously. The                            Model CF-HQ190L [id number]) scope was introduced             through the anus and advanced to the the cecum,                            identified by appendiceal orifice and ileocecal                            valve. The ileocecal valve, appendiceal orifice,                            and rectum were photographed. The quality of the                            bowel preparation was excellent. The colonoscopy                            was performed without difficulty. The patient                            tolerated the procedure well. The bowel preparation                            used was SUPREP. Scope In: 1:19:44 PM Scope Out: 1:32:35 PM Scope Withdrawal Time: 0 hours 9 minutes 26 seconds  Total Procedure Duration: 0 hours 12 minutes 51 seconds  Findings:                 A 3 mm polyp was found in the ascending colon. The                            polyp was removed with a cold snare. Resection and                            retrieval were complete.                           Multiple medium-mouthed diverticula were found in                            the right colon.  The exam was otherwise without abnormality on                            direct and retroflexion views. Complications:            No immediate complications. Estimated blood loss:                            None. Estimated Blood Loss:     Estimated blood loss: none. Impression:               - One 3 mm polyp in the ascending colon, removed                            with a cold snare. Resected and retrieved.                           - Diverticulosis in the right colon.                           - The examination was otherwise normal on direct                            and retroflexion views. Recommendation:           - Repeat colonoscopy in 5 years for surveillance.                           - Patient has a contact number available for                            emergencies. The signs and symptoms of potential                             delayed complications were discussed with the                            patient. Return to normal activities tomorrow.                            Written discharge instructions were provided to the                            patient.                           - Resume previous diet.                           - Continue present medications.                           - Await pathology results. Docia Chuck. Henrene Pastor, MD 04/30/2018 1:37:06 PM This report has been signed electronically.

## 2018-04-30 NOTE — Progress Notes (Signed)
Report to PACU, RN, vss, BBS= Clear.  

## 2018-04-30 NOTE — Patient Instructions (Signed)
YOU HAD AN ENDOSCOPIC PROCEDURE TODAY AT [facility]:   Refer to the procedure report that was given to you for any specific questions about what was found during the examination.  If the procedure report does not answer your questions, please call your gastroenterologist to clarify.  If you requested that your care partner not be given the details of your procedure findings, then the procedure report has been included in a sealed envelope for you to review at your convenience later.  YOU SHOULD EXPECT: Some feelings of bloating in the abdomen. Passage of more gas than usual.  Walking can help get rid of the air that was put into your GI tract during the procedure and reduce the bloating. If you had a lower endoscopy (such as a colonoscopy or flexible sigmoidoscopy) you may notice spotting of blood in your stool or on the toilet paper. If you underwent a bowel prep for your procedure, you may not have a normal bowel movement for a few days.  Please Note:  You might notice some irritation and congestion in your nose or some drainage.  This is from the oxygen used during your procedure.  There is no need for concern and it should clear up in a day or so.  SYMPTOMS TO REPORT IMMEDIATELY:   Following lower endoscopy (colonoscopy or flexible sigmoidoscopy):  Excessive amounts of blood in the stool  Significant tenderness or worsening of abdominal pains  Swelling of the abdomen that is new, acute  Fever of 100F or higher   For urgent or emergent issues, a gastroenterologist can be reached at any hour by calling [phone].   DIET:  We do recommend a small meal at first, but then you may proceed to your regular diet.  Drink plenty of fluids but you should avoid alcoholic beverages for 24 hours. Drink plenty of water every day  ACTIVITY:  You should plan to take it easy for the rest of today and you should NOT DRIVE or use heavy machinery until tomorrow (because of the sedation  medicines used during the test).    FOLLOW UP: Our staff will call the number listed on your records the next business day following your procedure to check on you and address any questions or concerns that you may have regarding the information given to you following your procedure. If we do not reach you, we will leave a message.  However, if you are feeling well and you are not experiencing any problems, there is no need to return our call.  We will assume that you have returned to your regular daily activities without incident.  If any biopsies were taken you will be contacted by phone or by letter within the next 1-3 weeks.  Please call us at [phone] if you have not heard about the biopsies in 3 weeks.    SIGNATURES/CONFIDENTIALITY: You and/or your care partner have signed paperwork which will be entered into your electronic medical record.  These signatures attest to the fact that that the information above on your After Visit Summary has been reviewed and is understood.  Full responsibility of the confidentiality of this discharge information lies with you and/or your care-partner.  You will need another colonoscopy in 5 years.

## 2018-04-30 NOTE — Progress Notes (Signed)
I have reviewed the patient's medical history in detail and updated the computerized patient record.

## 2018-05-01 ENCOUNTER — Telehealth: Payer: Self-pay | Admitting: *Deleted

## 2018-05-01 NOTE — Telephone Encounter (Signed)
  Follow up Call-  Call back number 04/30/2018  Post procedure Call Back phone  # [phone]  Permission to leave phone message Yes  Some recent data might be hidden     Patient questions:  Do you have a fever, pain , or abdominal swelling? No. Pain Score  0 *  Have you tolerated food without any problems? Yes.    Have you been able to return to your normal activities? Yes.    Do you have any questions about your discharge instructions: Diet   No. Medications  No. Follow up visit  No.  Do you have questions or concerns about your Care? No.  Actions: * If pain score is 4 or above: No action needed, pain <4.

## 2018-05-05 ENCOUNTER — Encounter: Payer: Self-pay | Admitting: Internal Medicine

## 2018-07-01 ENCOUNTER — Ambulatory Visit (INDEPENDENT_AMBULATORY_CARE_PROVIDER_SITE_OTHER): Payer: BLUE CROSS/BLUE SHIELD | Admitting: Medical

## 2018-07-01 ENCOUNTER — Encounter: Payer: Self-pay | Admitting: Medical

## 2018-07-01 ENCOUNTER — Encounter (INDEPENDENT_AMBULATORY_CARE_PROVIDER_SITE_OTHER): Payer: Self-pay

## 2018-07-01 VITALS — BP 125/69 | HR 67 | Temp 98.4°F | Resp 16 | Ht 62.0 in | Wt 166.6 lb

## 2018-07-01 DIAGNOSIS — K219 Gastro-esophageal reflux disease without esophagitis: Secondary | ICD-10-CM | POA: Diagnosis not present

## 2018-07-01 DIAGNOSIS — E119 Type 2 diabetes mellitus without complications: Secondary | ICD-10-CM

## 2018-07-01 MED ORDER — LEVOTHYROXINE SODIUM 50 MCG PO TABS
ORAL_TABLET | ORAL | 0 refills | Status: DC
Start: 2018-07-01 — End: 2018-11-02

## 2018-07-01 MED ORDER — FLUTICASONE PROPIONATE 50 MCG/ACT NA SUSP
2.0000 | Freq: Every day | NASAL | 1 refills | Status: DC
Start: 2018-07-01 — End: 2018-11-02

## 2018-07-01 MED ORDER — AZITHROMYCIN 250 MG PO TABS: ORAL_TABLET | ORAL | 0 refills | Status: DC

## 2018-07-01 MED ORDER — OMEPRAZOLE 40 MG PO CPDR: 40.0000 mg | DELAYED_RELEASE_CAPSULE | Freq: Every day | ORAL | 2 refills | Status: DC

## 2018-07-01 NOTE — Progress Notes (Signed)
Subjective:    Patient ID: Bailey Harris, female    DOB: March 22, 1961, 57 y.o.   MRN: [id number]  HPI  Pt in states recent reflux. She states does not eat greasy foods. Rare caffeine about twice a week. Does not eat chococalate. No alcohol. Recently even eating cereal causes symptoms. Pt had used ranitidine in the past. Now using omeprazole 20 mg that specialist rx'd. Her specialist did not do egd.   Pt is avoiding high sugar foods. But not exercising. Has been taking her metformin.  Review of Systems  Constitutional: Negative for chills, fatigue and fever.  HENT: Positive for congestion and ear pain. Negative for sinus pressure, sinus pain and tinnitus.        Ear pressure for one month. Mild pain.  Respiratory: Negative for cough, choking, chest tightness and wheezing.   Cardiovascular: Negative for chest pain and palpitations.  Gastrointestinal: Positive for abdominal pain. Negative for nausea and vomiting.       See hpi.  Endocrine: Negative for polydipsia, polyphagia and polyuria.  Genitourinary: Negative for difficulty urinating, flank pain and frequency.  Musculoskeletal: Negative for back pain, myalgias and neck pain.  Skin: Negative for rash.  Neurological: Negative for dizziness, light-headedness and headaches.  Hematological: Negative for adenopathy. Does not bruise/bleed easily.  Psychiatric/Behavioral: Negative for behavioral problems, decreased concentration and dysphoric mood. The patient is not nervous/anxious.     Past Medical History:  Diagnosis Date  . Anemia    during pregnancy  . Diabetes mellitus without complication ([place])   . Fatty liver   . GERD (gastroesophageal reflux disease)   . Heart murmur   . HLD (hyperlipidemia)   . Hypothyroidism   . Pancreatic insufficiency    insulin irregularity  . Vitamin D deficiency      Social History   Socioeconomic History  . Marital status: Married    Spouse name: Not on file  . Number of children: 3  . Years  of education: Not on file  . Highest education level: Not on file  Occupational History  . Occupation: home maker  Social Needs  . Financial resource strain: Not on file  . Food insecurity:    Worry: Not on file    Inability: Not on file  . Transportation needs:    Medical: Not on file    Non-medical: Not on file  Tobacco Use  . Smoking status: Never Smoker  . Smokeless tobacco: Never Used  Substance and Sexual Activity  . Alcohol use: No    Alcohol/week: 0.0 oz  . Drug use: No  . Sexual activity: Not on file  Lifestyle  . Physical activity:    Days per week: Not on file    Minutes per session: Not on file  . Stress: Not on file  Relationships  . Social connections:    Talks on phone: Not on file    Gets together: Not on file    Attends religious service: Not on file    Active member of club or organization: Not on file    Attends meetings of clubs or organizations: Not on file    Relationship status: Not on file  . Intimate partner violence:    Fear of current or ex partner: Not on file    Emotionally abused: Not on file    Physically abused: Not on file    Forced sexual activity: Not on file  Other Topics Concern  . 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 CESAREAN SECTION    . CHOLECYSTECTOMY N/A 03/31/2018   Procedure: LAPAROSCOPIC CHOLECYSTECTOMY WITH INTRAOPERATIVE CHOLANGIOGRAM;  Surgeon: Ralene Ok, MD;  Location: [facility];  Service: General;  Laterality: N/A;  . TUBAL LIGATION      Family History  Problem Relation Age of Onset  . Stomach cancer Mother        died at 81 from CA Mets  . CVA Father        died age 41  . CVA Sister        46- died  . CVA Brother        26 died    . Colon cancer Neg Hx   . Esophageal cancer Neg Hx   . Rectal cancer Neg Hx     No Known Allergies  Current Outpatient Medications on File Prior to Visit  Medication Sig Dispense Refill  . atorvastatin (LIPITOR) 40 MG tablet Take 1 tablet (40 mg total) by mouth daily. 90 tablet 3  . Calcium Carbonate (CALCIUM 600 PO) Take 1 tablet by mouth 2 (two) times daily.    Marland Kitchen ibuprofen (ADVIL,MOTRIN) 200 MG tablet Take 200 mg by mouth every 6 (six) hours as needed.    Marland Kitchen levothyroxine (SYNTHROID, LEVOTHROID) 50 MCG tablet Take 50 mcg by mouth daily before breakfast.    . metFORMIN (GLUCOPHAGE) 1000 MG tablet Take 1 tablet (1,000 mg total) by mouth 2 (two) times daily with a meal. 180 tablet 3  . metFORMIN (GLUCOPHAGE) 500 MG tablet TAKE 1 TABLET (500 MG TOTAL) BY MOUTH 2 (TWO) TIMES DAILY WITH A MEAL. 180 tablet 0  . ranitidine (ZANTAC) 150 MG capsule TOME UNA CAPSULA DOS VECES AL DIA 60 capsule 2  . SUPREP BOWEL PREP KIT 17.5-3.13-1.6 GM/177ML SOLN     . traMADol (ULTRAM) 50 MG tablet Take 1 tablet (50 mg total) by mouth every 6 (six) hours as needed. 20 tablet 0   Current Facility-Administered Medications on File Prior to Visit  Medication Dose Route Frequency Provider Last Rate Last Dose  . 0.9 %  sodium chloride infusion  500 mL Intravenous Once Irene Shipper, MD        Pulse 67   Temp 98.4 F (36.9 C) (Oral)   Resp 16   Ht 5' 2"  (1.575 m)   Wt 166 lb 9.6 oz (75.6 kg)   LMP 12/30/2010   SpO2 99%   BMI 30.47 kg/m       Objective:   Physical Exam  General Appearance- Not in acute distress.  Neck- no thyromegaly.  HEENT No sinus pressure. Rt ear canal clear. Tm normal. Left canal clear. Tm left side moderate pink/red.  Chest and Lung Exam Auscultation: Breath sounds:-Normal. Adventitious sounds:- No Adventitious sounds.  Cardiovascular Auscultation:Rythm - Regular. Heart Sounds -Normal heart sounds.  Abdomen Inspection:-Inspection Normal.  Palpation/Perucssion:  Palpation and Percussion of the abdomen reveal- Non Tender, No Rebound tenderness, No rigidity(Guarding) and No Palpable abdominal masses.  Liver:-Normal.  Spleen:- Normal.   Back- no cva tendenness.       Assessment & Plan:  Your sugar levels have been relatively well controlled but not tightly controlled.  We will repeat your A1c today and see if that has improved.  During the interim recommend that you continue metformin and eat low sugar diet.  For your history of reflux and recent increase of symptoms, continue healthy diet and I recommend increasing your omeprazole to 40 mg daily.  I sent prescription to your pharmacy.  If your insurance does not cover 40 mg dose then recommend getting over-the-counter omeprazole and taking the equivalent daily.  If your reflux symptoms do not improve with higher dose then would refer you back to your gastroenterologist and see if they think EGD might be indicated.  For your hypothyroidism, I did refill your thyroid medication.  And plan to get thyroid studies in 3 months.  Follow-up in 3 months or as needed.  For ear pain. Mild red tm. Will rx azithromycin and rx flonase nasal spray.  Mackie Pai, PA-C

## 2018-07-01 NOTE — Patient Instructions (Addendum)
Your sugar levels have been relatively well controlled but not tightly controlled.  We will repeat your A1c today and see if that has improved.  During the interim recommend that you continue metformin and eat low sugar diet.  For your history of reflux and recent increase of symptoms, continue healthy diet and I recommend increasing your omeprazole to 40 mg daily.  I sent prescription to your pharmacy.  If your insurance does not cover 40 mg dose then recommend getting over-the-counter omeprazole and taking the equivalent daily.  If your reflux symptoms do not improve with higher dose then would refer you back to your gastroenterologist and see if they think EGD might be indicated.  For your hypothyroidism, I did refill your thyroid medication.  And plan to get thyroid studies in 3 months.  Follow-up in 3 months or as needed.

## 2018-07-02 LAB — COMPREHENSIVE METABOLIC PANEL
AG Ratio: 1.7 (calc) (ref 1.0–2.5)
ALT: 28 U/L (ref 6–29)
AST: 23 U/L (ref 10–35)
Albumin: 4.5 g/dL (ref 3.6–5.1)
Alkaline phosphatase (APISO): 105 U/L (ref 33–130)
BUN: 11 mg/dL (ref 7–25)
CALCIUM: 9.5 mg/dL (ref 8.6–10.4)
CHLORIDE: 105 mmol/L (ref 98–110)
CO2: 24 mmol/L (ref 20–32)
CREATININE: 0.55 mg/dL (ref 0.50–1.05)
GLOBULIN: 2.6 g/dL (ref 1.9–3.7)
GLUCOSE: 97 mg/dL (ref 65–99)
POTASSIUM: 4.5 mmol/L (ref 3.5–5.3)
Sodium: 139 mmol/L (ref 135–146)
Total Bilirubin: 0.9 mg/dL (ref 0.2–1.2)
Total Protein: 7.1 g/dL (ref 6.1–8.1)

## 2018-07-02 LAB — HEMOGLOBIN A1C
EAG (MMOL/L): 7.3 (calc)
Hgb A1c MFr Bld: 6.2 % of total Hgb — ABNORMAL HIGH (ref <5.7)
MEAN PLASMA GLUCOSE: 131 (calc)

## 2018-07-16 ENCOUNTER — Other Ambulatory Visit: Payer: Self-pay

## 2018-07-16 MED ORDER — METFORMIN HCL 1000 MG PO TABS: 1000.0000 mg | ORAL_TABLET | Freq: Two times a day (BID) | ORAL | 3 refills | Status: DC

## 2018-07-25 ENCOUNTER — Other Ambulatory Visit: Payer: Self-pay | Admitting: Medical

## 2018-09-18 ENCOUNTER — Encounter: Payer: Self-pay | Admitting: Medical

## 2018-09-18 ENCOUNTER — Ambulatory Visit (INDEPENDENT_AMBULATORY_CARE_PROVIDER_SITE_OTHER): Payer: BLUE CROSS/BLUE SHIELD | Admitting: Medical

## 2018-09-18 VITALS — BP 125/61 | HR 62 | Temp 98.1°F | Resp 16 | Ht 62.0 in | Wt 164.8 lb

## 2018-09-18 DIAGNOSIS — Z23 Encounter for immunization: Secondary | ICD-10-CM

## 2018-09-18 DIAGNOSIS — L80 Vitiligo: Secondary | ICD-10-CM

## 2018-09-18 DIAGNOSIS — R300 Dysuria: Secondary | ICD-10-CM | POA: Diagnosis not present

## 2018-09-18 DIAGNOSIS — R1013 Epigastric pain: Secondary | ICD-10-CM

## 2018-09-18 DIAGNOSIS — R7989 Other specified abnormal findings of blood chemistry: Secondary | ICD-10-CM

## 2018-09-18 DIAGNOSIS — E785 Hyperlipidemia, unspecified: Secondary | ICD-10-CM

## 2018-09-18 DIAGNOSIS — Z124 Encounter for screening for malignant neoplasm of cervix: Secondary | ICD-10-CM

## 2018-09-18 LAB — POC URINALSYSI DIPSTICK (AUTOMATED)
Bilirubin, UA: NEGATIVE
Glucose, UA: NEGATIVE
Ketones, UA: NEGATIVE
LEUKOCYTES UA: NEGATIVE
NITRITE UA: NEGATIVE
Protein, UA: NEGATIVE
Spec Grav, UA: >=1.03 — AB (ref 1.010–1.025)
UROBILINOGEN UA: NEGATIVE U/dL — AB
pH, UA: 6 (ref 5.0–8.0)

## 2018-09-18 MED ORDER — CIPROFLOXACIN HCL 500 MG PO TABS: 500.0000 mg | ORAL_TABLET | Freq: Two times a day (BID) | ORAL | 0 refills | Status: DC

## 2018-09-18 MED ORDER — ATORVASTATIN CALCIUM 40 MG PO TABS: 40.0000 mg | ORAL_TABLET | Freq: Every day | ORAL | 3 refills | Status: DC

## 2018-09-18 NOTE — Patient Instructions (Addendum)
For your persistent epigastric pain/probable persisting reflux, I want you to continue omeprazole and add pepcid over-the-counter.  Eat bland healthy foods.  We will get a metabolic panel today and include amylase/lipase.  I did go ahead and place referral to gastroenterology/Dr. Henrene Pastor.  Since it appears that you not responding to omeprazole you may benefit from EGD.  Also think remote possibility that you might have retained common bile duct stone?  For frequent urination and dysuria, we did a urinalysis and sending it urine culture.  During the interim I am prescribing Cipro antibiotic.  For history of hyperlipidemia, I am refilling your lipid medication and will get lipid panel today.  For probable vitiligo, referring you to dermatologist.  For history of low vitamin D, repeating vitamin D level.  For diabetes, recommend that you continue metformin.  Continue low sugar diet.  Presently too early to do hemoglobin A1c.  We will do that on your next visit.  Follow-up date to be determined after lab review.

## 2018-09-18 NOTE — Progress Notes (Signed)
Subjective:    Patient ID: Bailey Harris, female    DOB: 08/08/1961, 57 y.o.   MRN: [id number]  HPI   Pt in with some report of abdomen pain. She states she has pain after eating. Pt has reflux hx. Pt has seen specialist about one year or more ago. She was placed on omeprazole. She has burning sensation when eats. Pt never did egd. It was considered but insurance would not cover.  Pt also update me that she has not been taking thryoid medication since when she takes tablets it will hurt her stomach since she is not able to eat due to the above.  Pt states ran out of her cholesterol medication 2 months ago.  Pt is on medication for diabetes. States tolerated metformin. No side effects.  Pt states one month of some pain on urination. Some increased frequency of urination. Mild pressure in suprapubic area.   1 year of hypopigmented patche to rt arm and rt leg. Not worsening in size but not improving either.   Review of Systems  Constitutional: Negative for chills, fatigue and fever.  Respiratory: Negative for cough, chest tightness, shortness of breath and wheezing.   Cardiovascular: Negative for chest pain and palpitations.  Gastrointestinal: Positive for abdominal pain. Negative for abdominal distention, blood in stool, constipation, nausea, rectal pain and vomiting.  Genitourinary: Positive for dysuria and frequency. Negative for difficulty urinating, flank pain, hematuria and urgency.  Musculoskeletal: Negative for back pain.  Skin: Negative for rash.  Neurological: Negative for dizziness, syncope, weakness and headaches.  Hematological: Negative for adenopathy. Does not bruise/bleed easily.  Psychiatric/Behavioral: Negative for behavioral problems.    Past Medical History:  Diagnosis Date  . Anemia    during pregnancy  . Diabetes mellitus without complication ([place])   . Fatty liver   . GERD (gastroesophageal reflux disease)   . Heart murmur   . HLD (hyperlipidemia)   .  Hypothyroidism   . Pancreatic insufficiency    insulin irregularity  . Vitamin D deficiency      Social History   Socioeconomic History  . Marital status: Married    Spouse name: Not on file  . Number of children: 3  . Years of education: Not on file  . Highest education level: Not on file  Occupational History  . Occupation: home maker  Social Needs  . Financial resource strain: Not on file  . Food insecurity:    Worry: Not on file    Inability: Not on file  . Transportation needs:    Medical: Not on file    Non-medical: Not on file  Tobacco Use  . Smoking status: Never Smoker  . Smokeless tobacco: Never Used  Substance and Sexual Activity  . Alcohol use: No    Alcohol/week: 0.0 standard drinks  . Drug use: No  . Sexual activity: Not on file  Lifestyle  . Physical activity:    Days per week: Not on file    Minutes per session: Not on file  . Stress: Not on file  Relationships  . Social connections:    Talks on phone: Not on file    Gets together: Not on file    Attends religious service: Not on file    Active member of club or organization: Not on file    Attends meetings of clubs or organizations: Not on file    Relationship status: Not on file  . Intimate partner violence:    Fear of current or ex partner:  Not on file    Emotionally abused: Not on file    Physically abused: Not on file    Forced sexual activity: Not on file  Other Topics Concern  . 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 CESAREAN SECTION    . CHOLECYSTECTOMY N/A 03/31/2018   Procedure: LAPAROSCOPIC CHOLECYSTECTOMY WITH INTRAOPERATIVE CHOLANGIOGRAM;  Surgeon: Ralene Ok, MD;  Location: [facility];  Service: General;   Laterality: N/A;  . TUBAL LIGATION      Family History  Problem Relation Age of Onset  . Stomach cancer Mother        died at 51 from CA Mets  . CVA Father        died age 30  . CVA Sister        73- died  . CVA Brother        27 died  . Colon cancer Neg Hx   . Esophageal cancer Neg Hx   . Rectal cancer Neg Hx     No Known Allergies  Current Outpatient Medications on File Prior to Visit  Medication Sig Dispense Refill  . atorvastatin (LIPITOR) 40 MG tablet Take 1 tablet (40 mg total) by mouth daily. 90 tablet 3  . azithromycin (ZITHROMAX) 250 MG tablet Take 2 tablets by mouth on day 1, followed by 1 tablet by mouth daily for 4 days. 6 tablet 0  . Calcium Carbonate (CALCIUM 600 PO) Take 1 tablet by mouth 2 (two) times daily.    . fluticasone (FLONASE) 50 MCG/ACT nasal spray Place 2 sprays into both nostrils daily. 16 g 1  . ibuprofen (ADVIL,MOTRIN) 200 MG tablet Take 200 mg by mouth every 6 (six) hours as needed.    Marland Kitchen levothyroxine (SYNTHROID, LEVOTHROID) 50 MCG tablet Take 50 mcg by mouth daily before breakfast.    . levothyroxine (SYNTHROID, LEVOTHROID) 50 MCG tablet TOME UNA TABLETA TODOS LOS DIAS 90 tablet 0  . metFORMIN (GLUCOPHAGE) 1000 MG tablet Take 1 tablet (1,000 mg total) by mouth 2 (two) times daily with a meal. 180 tablet 3  . omeprazole (PRILOSEC) 40 MG capsule Take 1 capsule (40 mg total) by mouth daily. 30 capsule 2  . ranitidine (ZANTAC) 150 MG capsule TOME UNA CAPSULA DOS VECES AL DIA 60 capsule 2  . SUPREP BOWEL PREP KIT 17.5-3.13-1.6 GM/177ML SOLN     . traMADol (ULTRAM) 50 MG tablet Take 1 tablet (50 mg total) by mouth every 6 (six) hours as needed. 20 tablet 0   Current Facility-Administered Medications on File Prior to Visit  Medication Dose Route Frequency Provider Last Rate Last Dose  . 0.9 %  sodium chloride infusion  500 mL Intravenous Once Irene Shipper, MD        BP 125/61   Pulse 62   Temp 98.1 F (36.7 C) (Oral)   Resp 16   Ht '5\' 2"'$  (1.575  m)   Wt 164 lb 12.8 oz (74.8 kg)   LMP 12/30/2010   SpO2 99%   BMI 30.14 kg/m       Objective:   Physical Exam  General Appearance- Not in acute  distress.  HEENT Eyes- Scleraeral/Conjuntiva-bilat- Not Yellow. Mouth & Throat- Normal.  Chest and Lung Exam Auscultation: Breath sounds:-Normal. Adventitious sounds:- No Adventitious sounds.  Cardiovascular Auscultation:Rythm - Regular. Heart Sounds -Normal heart sounds.  Abdomen Inspection:-Inspection Normal.  Palpation/Perucssion: Palpation and Percussion of the abdomen reveal- faint epigastric Tender, No Rebound tenderness, No rigidity(Guarding) and No Palpable abdominal masses.  Liver:-Normal.  Spleen:- Normal.   Skin- small 2-3 mm hypopigmented spots rt upper arm and 2 small 2-3 mm hypopigmented spots lateral aspect rt calf.       Assessment & Plan:  For your persistent epigastric pain/probable persisting reflux, I want you to continue omeprazole and add Pepcid over-the-counter.  Eat bland healthy foods.  We will get a metabolic panel today and include amylase/lipase.  I did go ahead and place referral to gastroenterology/Dr. Henrene Pastor.  Since it appears that you not responding to omeprazole you may benefit from EGD.  Also think remote possibility that you might have retained common bile duct stone?  For frequent urination and dysuria, we did a urinalysis and sending it urine culture.  During the interim I am prescribing Cipro antibiotic.  For history of hyperlipidemia, I am refilling your lipid medication and will get lipid panel today.  For probable vitiligo, referring you to dermatologist.  For history of low vitamin D, repeating vitamin D level.  For diabetes, recommend that you continue metformin.  Continue low sugar diet.  Presently too early to do hemoglobin A1c.  We will do that on your next visit.  Follow-up date to be determined after lab review.  40 minute spent with pt today counseling her on her various  diagnosis today as well as coordinating care.  Mackie Pai, PA-C

## 2018-09-20 LAB — URINE CULTURE
MICRO NUMBER: 91132114
SPECIMEN QUALITY: ADEQUATE

## 2018-10-21 ENCOUNTER — Encounter: Payer: BLUE CROSS/BLUE SHIELD | Admitting: Obstetrics & Gynecology

## 2018-11-02 ENCOUNTER — Ambulatory Visit (INDEPENDENT_AMBULATORY_CARE_PROVIDER_SITE_OTHER): Payer: BLUE CROSS/BLUE SHIELD | Admitting: Internal Medicine

## 2018-11-02 ENCOUNTER — Encounter: Payer: Self-pay | Admitting: Internal Medicine

## 2018-11-02 VITALS — BP 114/74 | HR 76 | Ht 62.5 in | Wt 167.0 lb

## 2018-11-02 DIAGNOSIS — L815 Leukoderma, not elsewhere classified: Secondary | ICD-10-CM | POA: Diagnosis not present

## 2018-11-02 DIAGNOSIS — I781 Nevus, non-neoplastic: Secondary | ICD-10-CM | POA: Diagnosis not present

## 2018-11-02 DIAGNOSIS — R1013 Epigastric pain: Secondary | ICD-10-CM

## 2018-11-02 DIAGNOSIS — L814 Other melanin hyperpigmentation: Secondary | ICD-10-CM | POA: Diagnosis not present

## 2018-11-02 DIAGNOSIS — Z1283 Encounter for screening for malignant neoplasm of skin: Secondary | ICD-10-CM | POA: Diagnosis not present

## 2018-11-02 NOTE — Progress Notes (Signed)
HISTORY OF PRESENT ILLNESS:  Bailey Harris is a 57 y.o. female, non-English-speaking native of [place] accompanied by a Psychologist, sport and exercise, who is sent today by her primary care provider regarding chronic epigastric pain.  She has a history of hyperlipidemia, obesity, hypothyroidism, and diabetes.  The patient was initially evaluated in this office September 11, 2017 regarding GERD, epigastric pain, chronic functional constipation, proctalgia few jacks, fatty liver, and the need for surveillance colonoscopy.  See that dictation for details.  Colonoscopy and upper endoscopy were recommended.  The patient declined upper endoscopy secondary to suboptimal insurance coverage.  She did however undergo colonoscopy Apr 30, 2018.  Examination revealed right-sided diverticulosis and a diminutive adenoma which was removed.  Follow-up in 5 years recommended.  She did undergo cholecystectomy in April due to the finding of a gallstone on ultrasound and ongoing complaints of pain.  She states that she was pain-free for 1 month only to have her discomfort return.  She does take omeprazole daily for reflux.  She takes this at the proper time.  She tells me that she has had varying degrees of the same epigastric pain for years.  She tells me that the discomfort occurs about every other day.  She did go 2 weeks without pain.  The pain may be so severe that she cannot do her housework.  She did seem to noticed that discomfort may have improved with exercise.  She wonders about stress.  Meals have no effect on the pain.  It tends to be radiation of the discomfort into her back though she is not sure whether she has a back problem primarily or not.  Her weight has been stable.  No vomiting.  No dysphasia.  No change in bowel habits.  Her most recent CT scan did not show any acute abnormalities.  Most recent blood work from July 2019 revealed normal liver tests.  REVIEW OF SYSTEMS:  All non-GI ROS negative unless otherwise  stated in the HPI except for chest swelling, shoulder pain,  Past Medical History:  Diagnosis Date  . Anemia    during pregnancy  . Diabetes mellitus without complication ([place])   . Fatty liver   . GERD (gastroesophageal reflux disease)   . Heart murmur   . HLD (hyperlipidemia)   . Hypothyroidism   . Pancreatic insufficiency    insulin irregularity  . Vitamin D deficiency     Past Surgical History:  Procedure Laterality Date  . CESAREAN SECTION    . CHOLECYSTECTOMY N/A 03/31/2018   Procedure: LAPAROSCOPIC CHOLECYSTECTOMY WITH INTRAOPERATIVE CHOLANGIOGRAM;  Surgeon: Ralene Ok, MD;  Location: [facility];  Service: General;  Laterality: N/A;  . TUBAL LIGATION      Social History Bailey Harris  reports that she has never smoked. She has never used smokeless tobacco. She reports that she does not drink alcohol or use drugs.  family history includes CVA in her brother, father, and sister; Stomach cancer in her mother.  No Known Allergies     PHYSICAL EXAMINATION: Vital signs: BP 114/74   Pulse 76   Ht 5' 2.5" (1.588 m)   Wt 167 lb (75.8 kg)   LMP 12/30/2010   BMI 30.06 kg/m   Constitutional: generally well-appearing, no acute distress Psychiatric: alert and oriented x3, cooperative Eyes: extraocular movements intact, anicteric, conjunctiva pink Mouth: oral pharynx moist, no lesions Neck: supple no lymphadenopathy Cardiovascular: heart regular rate and rhythm, no murmur Lungs: clear to auscultation bilaterally Abdomen: soft, obese, mild tenderness in the epigastric region  with minimal palpation, nondistended, no obvious ascites, no peritoneal signs, normal bowel sounds, no organomegaly Rectal: Omitted Extremities: no clubbing, cyanosis, or lower extremity edema bilaterally Skin: no lesions on visible extremities Neuro: No focal deficits.  Cranial nerves intact  ASSESSMENT:  1.  Chronic epigastric pain.  Likely functional 2.  GERD.  On PPI 3.  Obesity 4.  History of  adenomatous colon polyps 5.  Multiple medical problems 6.  Non-GI complaints   PLAN:  1.  Reflux precautions 2.  Offered the patient upper endoscopy.  She declined due to lack of insurance coverage.  She wished for alternative diagnostic studies.  I offered her upper GI series.  She agrees 3.  Continue PPI 4.  May increase PPI to twice daily short-term to see if this helps.  We will wait for her upper GI series results first 5.  Surveillance colonoscopy 5 years 6.  See PCP regarding non-GI complaints such as shoulder discomfort  25-minute spent face-to-face with the patient.  Greater than 50% of the time used for counseling regarding her chronic epigastric pain, my impressions as to the cause, and work-up plans and treatment as outlined

## 2018-11-02 NOTE — Patient Instructions (Signed)
You have been scheduled for an Upper GI Series at [facility]. Your appointment is on 11-20/2019 at 9:30am. Please arrive 15 minutes prior to your test for registration. Make sure not to eat or drink anything after midnight on the night before your test. If you need to reschedule, please call radiology at [phone]. ________________________________________________________________ An upper GI series uses x rays to help diagnose problems of the upper GI tract, which includes the esophagus, stomach, and duodenum. The duodenum is the first part of the small intestine. An upper GI series is conducted by a radiology technologist or a radiologist-a doctor who specializes in x-ray imaging-at a hospital or outpatient center. While sitting or standing in front of an x-ray machine, the patient drinks barium liquid, which is often white and has a chalky consistency and taste. The barium liquid coats the lining of the upper GI tract and makes signs of disease show up more clearly on x rays. X-ray video, called fluoroscopy, is used to view the barium liquid moving through the esophagus, stomach, and duodenum. Additional x rays and fluoroscopy are performed while the patient lies on an x-ray table. To fully coat the upper GI tract with barium liquid, the technologist or radiologist may press on the abdomen or ask the patient to change position. Patients hold still in various positions, allowing the technologist or radiologist to take x rays of the upper GI tract at different angles. If a technologist conducts the upper GI series, a radiologist will later examine the images to look for problems.  This test typically takes about 1 hour to complete. __________________________________________________________________

## 2018-11-18 ENCOUNTER — Ambulatory Visit (HOSPITAL_COMMUNITY)
Admission: RE | Admit: 2018-11-18 | Discharge: 2018-11-18 | Disposition: A | Discharge status: Home / Self Care | Payer: BLUE CROSS/BLUE SHIELD | Source: Ambulatory Visit | Attending: Internal Medicine | Admitting: Internal Medicine

## 2018-11-18 ENCOUNTER — Ambulatory Visit (INDEPENDENT_AMBULATORY_CARE_PROVIDER_SITE_OTHER): Payer: BLUE CROSS/BLUE SHIELD | Admitting: Obstetrics and Gynecology

## 2018-11-18 ENCOUNTER — Encounter: Payer: Self-pay | Admitting: Obstetrics and Gynecology

## 2018-11-18 VITALS — BP 121/72 | HR 72 | Ht 62.0 in | Wt 166.1 lb

## 2018-11-18 DIAGNOSIS — Z124 Encounter for screening for malignant neoplasm of cervix: Secondary | ICD-10-CM | POA: Diagnosis not present

## 2018-11-18 DIAGNOSIS — R1013 Epigastric pain: Secondary | ICD-10-CM

## 2018-11-18 DIAGNOSIS — Z1151 Encounter for screening for human papillomavirus (HPV): Secondary | ICD-10-CM | POA: Diagnosis not present

## 2018-11-18 DIAGNOSIS — R110 Nausea: Secondary | ICD-10-CM | POA: Diagnosis not present

## 2018-11-18 DIAGNOSIS — Z01419 Encounter for gynecological examination (general) (routine) without abnormal findings: Secondary | ICD-10-CM

## 2018-11-18 NOTE — Progress Notes (Signed)
Subjective:     Bailey Harris is a 57 y.o. female P3 postmenopausal for 5 years with BMI 30 who is here for a comprehensive physical exam. The patient reports no problems. Patient denies any episodes of vaginal bleeding. She denies any pelvic pain or abnormal discharge. Patient denies any urinary incontinence other than small amount of leaking with coughing, laughing or sneezing. Patient is sexually active without complaints.   Past Medical History:  Diagnosis Date  . Anemia    during pregnancy  . Diabetes mellitus without complication ([place])   . Fatty liver   . GERD (gastroesophageal reflux disease)   . Heart murmur   . HLD (hyperlipidemia)   . Hypothyroidism   . Pancreatic insufficiency    insulin irregularity  . Vitamin D deficiency    Past Surgical History:  Procedure Laterality Date  . CESAREAN SECTION    . CHOLECYSTECTOMY N/A 03/31/2018   Procedure: LAPAROSCOPIC CHOLECYSTECTOMY WITH INTRAOPERATIVE CHOLANGIOGRAM;  Surgeon: Ralene Ok, MD;  Location: [facility];  Service: General;  Laterality: N/A;  . TUBAL LIGATION     Family History  Problem Relation Age of Onset  . Stomach cancer Mother        died at 7 from CA Mets  . CVA Father        died age 64  . CVA Sister        85- died  . CVA Brother        79 died  . Colon cancer Neg Hx   . Esophageal cancer Neg Hx   . Rectal cancer Neg Hx     Social History   Socioeconomic History  . Marital status: Married    Spouse name: Not on file  . Number of children: 3  . Years of education: Not on file  . Highest education level: Not on file  Occupational History  . Occupation: home maker  Social Needs  . Financial resource strain: Not on file  . Food insecurity:    Worry: Not on file    Inability: Not on file  . Transportation needs:    Medical: Not on file    Non-medical: Not on file  Tobacco Use  . Smoking status: Never Smoker  . Smokeless tobacco: Never Used  Substance and Sexual Activity  . Alcohol use: No    Alcohol/week: 0.0 standard drinks  . Drug use: No  . Sexual activity: Not on file  Lifestyle  . Physical activity:    Days per week: Not on file    Minutes per session: Not on file  . Stress: Not on file  Relationships  . Social connections:    Talks on phone: Not on file    Gets together: Not on file    Attends religious service: Not on file    Active member of club or organization: Not on file    Attends meetings of clubs or organizations: Not on file    Relationship status: Not on file  . Intimate partner violence:    Fear of current or ex partner: Not on file    Emotionally abused: Not on file    Physically abused: Not on file    Forced sexual activity: Not on file  Other Topics Concern  . 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 URINE MICROALBUMIN  05/02/2017  . OPHTHALMOLOGY EXAM  08/28/2017  . FOOT EXAM  11/25/2017  . HEMOGLOBIN A1C  01/01/2019  . PAP SMEAR  05/02/2019  . MAMMOGRAM  07/30/2019  . TETANUS/TDAP  12/30/2020  . COLONOSCOPY  05/01/2023  . INFLUENZA VACCINE  Completed  . PNEUMOCOCCAL POLYSACCHARIDE VACCINE AGE 53-64 HIGH RISK  Completed  . Hepatitis C Screening  Completed  . HIV Screening  Completed       Review of Systems Pertinent items are noted in HPI.   Objective:  Blood pressure 121/72, pulse 72, height 5' 2"  (1.575 m), weight 166 lb 1.3 oz (75.3 kg), last menstrual period 12/30/2010.      GENERAL: Well-developed, well-nourished female in no acute distress.  HEENT: Normocephalic, atraumatic. Sclerae anicteric.  NECK: Supple. Normal thyroid.  LUNGS: Clear to auscultation bilaterally.  HEART: Regular rate and rhythm. BREASTS: Symmetric in size. No palpable masses or lymphadenopathy,  skin changes, or nipple drainage. ABDOMEN: Soft, nontender, nondistended. No organomegaly. PELVIC: Normal external female genitalia. Vagina is pink and rugated.  Normal discharge. Normal appearing cervix. Uterus is normal in size. No adnexal mass or tenderness. EXTREMITIES: No cyanosis, clubbing, or edema, 2+ distal pulses.  Assessment:    Healthy female exam.      Plan:    Pap smear collected Screening mammogram ordered Patient will be contacted with any abnormal results RTC in 1 year or prn See After Visit Summary for Counseling Recommendations

## 2018-11-18 NOTE — Progress Notes (Signed)
[place]

## 2018-11-19 ENCOUNTER — Telehealth: Payer: Self-pay | Admitting: Internal Medicine

## 2018-11-19 NOTE — Telephone Encounter (Signed)
Spoke to spouse in [place] and given Dr Blanch Media recommendations. No questions at this time.

## 2018-11-20 LAB — CYTOLOGY - PAP
Diagnosis: NEGATIVE
HPV (WINDOPATH): NOT DETECTED

## 2018-11-25 ENCOUNTER — Ambulatory Visit (HOSPITAL_BASED_OUTPATIENT_CLINIC_OR_DEPARTMENT_OTHER)
Admission: RE | Admit: 2018-11-25 | Discharge: 2018-11-25 | Disposition: A | Discharge status: Home / Self Care | Payer: BLUE CROSS/BLUE SHIELD | Source: Ambulatory Visit | Attending: Obstetrics and Gynecology | Admitting: Obstetrics and Gynecology

## 2018-11-25 DIAGNOSIS — Z01419 Encounter for gynecological examination (general) (routine) without abnormal findings: Secondary | ICD-10-CM | POA: Diagnosis not present

## 2018-11-25 DIAGNOSIS — Z1231 Encounter for screening mammogram for malignant neoplasm of breast: Secondary | ICD-10-CM | POA: Diagnosis not present

## 2018-12-10 ENCOUNTER — Other Ambulatory Visit: Payer: Self-pay | Admitting: Medical

## 2019-02-10 ENCOUNTER — Other Ambulatory Visit: Payer: Self-pay | Admitting: Medical

## 2019-02-19 ENCOUNTER — Ambulatory Visit (HOSPITAL_BASED_OUTPATIENT_CLINIC_OR_DEPARTMENT_OTHER)
Admission: RE | Admit: 2019-02-19 | Discharge: 2019-02-19 | Disposition: A | Discharge status: Home / Self Care | Payer: BLUE CROSS/BLUE SHIELD | Source: Ambulatory Visit | Attending: Medical | Admitting: Medical

## 2019-02-19 ENCOUNTER — Ambulatory Visit: Payer: BLUE CROSS/BLUE SHIELD | Admitting: Medical

## 2019-02-19 ENCOUNTER — Encounter: Payer: Self-pay | Admitting: Medical

## 2019-02-19 VITALS — BP 120/66 | HR 65 | Temp 98.1°F | Resp 16 | Ht 62.0 in | Wt 169.8 lb

## 2019-02-19 DIAGNOSIS — L603 Nail dystrophy: Secondary | ICD-10-CM | POA: Diagnosis not present

## 2019-02-19 DIAGNOSIS — M25511 Pain in right shoulder: Secondary | ICD-10-CM | POA: Insufficient documentation

## 2019-02-19 DIAGNOSIS — R0789 Other chest pain: Secondary | ICD-10-CM

## 2019-02-19 DIAGNOSIS — E039 Hypothyroidism, unspecified: Secondary | ICD-10-CM | POA: Diagnosis not present

## 2019-02-19 DIAGNOSIS — G8929 Other chronic pain: Secondary | ICD-10-CM

## 2019-02-19 DIAGNOSIS — D179 Benign lipomatous neoplasm, unspecified: Secondary | ICD-10-CM

## 2019-02-19 DIAGNOSIS — K5900 Constipation, unspecified: Secondary | ICD-10-CM | POA: Diagnosis not present

## 2019-02-19 DIAGNOSIS — K219 Gastro-esophageal reflux disease without esophagitis: Secondary | ICD-10-CM

## 2019-02-19 LAB — TROPONIN I: TNIDX: 0.01 ug/l (ref 0.00–0.06)

## 2019-02-19 LAB — T4, FREE: Free T4: 0.93 ng/dL (ref 0.60–1.60)

## 2019-02-19 LAB — TSH: TSH: 0.94 u[IU]/mL (ref 0.35–4.50)

## 2019-02-19 MED ORDER — CICLOPIROX 8 % EX SOLN: Freq: Every day | CUTANEOUS | 0 refills | Status: DC

## 2019-02-19 MED ORDER — DICLOFENAC SODIUM 75 MG PO TBEC: 75.0000 mg | DELAYED_RELEASE_TABLET | Freq: Two times a day (BID) | ORAL | 0 refills | Status: DC

## 2019-02-19 MED ORDER — FLUTICASONE PROPIONATE 50 MCG/ACT NA SUSP: 2.0000 | Freq: Every day | NASAL | 1 refills | Status: DC

## 2019-02-19 NOTE — Progress Notes (Signed)
Subjective:    Patient ID: Bailey Harris, female    DOB: Jul 13, 1961, 58 y.o.   MRN: [id number]  HPI   Pt in for follow up.  Pt states she had her epigastric area pain seemed to get better with omeprazole 40 mg. Pt has been see by GI for chronic abdomen pain. Pt was offered egd/endcospy but she passed due to cost/has no insurance. Upper gi series was negative in November. Pt had gallbladder removed last year. She know has direct pain for 2 weeks over xyphoid process. At rest no pain. But when she touches area directly or twists she has pain. No recent exercise. No abdomen crutches.  Pt does report last 2 months that she has had intermittent episodes of feeling constipated and then loose stools. States has bowel pattern of constipation for 2-3 days then next day loose stools. Pt states today has loose stools. She states went had 3 loose stools today.   Pt also reports various areas on her body of small lumps on her body. She can palpate and feel. But they are not obvious.  Pt has rt shoulder. Pt hurts to lift rt arm above her head.  Rt ear pain of past 3 months. Mid sensation as if something in ear.Some sneezing. Mild nasal congestion.     Review of Systems  Constitutional: Negative for chills, fatigue and fever.  Respiratory: Negative for chest tightness, shortness of breath and wheezing.   Cardiovascular: Negative for chest pain and palpitations.  Gastrointestinal: Positive for abdominal pain.  Musculoskeletal: Negative for back pain.       Xyphoid area pain.   Rt shoulder pain.  Skin: Negative for rash.       Possible scattered small early lipomas.   Neurological: Negative for dizziness, seizures, weakness and headaches.  Hematological: Negative for adenopathy. Does not bruise/bleed easily.  Psychiatric/Behavioral: Negative for behavioral problems and dysphoric mood. The patient is not nervous/anxious.     Past Medical History:  Diagnosis Date  . Anemia    during  pregnancy  . Diabetes mellitus without complication ([facility])   . Fatty liver   . GERD (gastroesophageal reflux disease)   . Heart murmur   . HLD (hyperlipidemia)   . Hypothyroidism   . Pancreatic insufficiency    insulin irregularity  . Vitamin D deficiency      Social History   Socioeconomic History  . Marital status: Married    Spouse name: Not on file  . Number of children: 3  . Years of education: Not on file  . Highest education level: Not on file  Occupational History  . Occupation: home maker  Social Needs  . Financial resource strain: Not on file  . Food insecurity:    Worry: Not on file    Inability: Not on file  . Transportation needs:    Medical: Not on file    Non-medical: Not on file  Tobacco Use  . Smoking status: Never Smoker  . Smokeless tobacco: Never Used  Substance and Sexual Activity  . Alcohol use: No    Alcohol/week: 0.0 standard drinks  . Drug use: No  . Sexual activity: Not on file  Lifestyle  . Physical activity:    Days per week: Not on file    Minutes per session: Not on file  . Stress: Not on file  Relationships  . Social connections:    Talks on phone: Not on file    Gets together: Not on file    Attends  religious service: Not on file    Active member of club or organization: Not on file    Attends meetings of clubs or organizations: Not on file    Relationship status: Not on file  . Intimate partner violence:    Fear of current or ex partner: Not on file    Emotionally abused: Not on file    Physically abused: Not on file    Forced sexual activity: Not on file  Other Topics Concern  . 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 CESAREAN SECTION    .  CHOLECYSTECTOMY N/A 03/31/2018   Procedure: LAPAROSCOPIC CHOLECYSTECTOMY WITH INTRAOPERATIVE CHOLANGIOGRAM;  Surgeon: Ralene Ok, MD;  Location: [facility];  Service: General;  Laterality: N/A;  . TUBAL LIGATION      Family History  Problem Relation Age of Onset  . Stomach cancer Mother        died at 29 from CA Mets  . CVA Father        died age 56  . CVA Sister        1- died  . CVA Brother        92 died  . Colon cancer Neg Hx   . Esophageal cancer Neg Hx   . Rectal cancer Neg Hx     No Known Allergies  Current Outpatient Medications on File Prior to Visit  Medication Sig Dispense Refill  . atorvastatin (LIPITOR) 40 MG tablet Take 1 tablet (40 mg total) by mouth daily. 90 tablet 3  . Calcium Carbonate Antacid (TUMS E-X PO) Take by mouth as needed.    Marland Kitchen ibuprofen (ADVIL,MOTRIN) 200 MG tablet Take 200 mg by mouth every 6 (six) hours as needed.    Marland Kitchen levothyroxine (SYNTHROID, LEVOTHROID) 50 MCG tablet TOME UNA TABLETA TODOS LOS DIAS 90 tablet 0  . metFORMIN (GLUCOPHAGE) 1000 MG tablet Take 1 tablet (1,000 mg total) by mouth 2 (two) times daily with a meal. 180 tablet 3  . omeprazole (PRILOSEC) 40 MG capsule TOME UNA CAPSULA TODOS LOS DIAS 90 capsule 2   No current facility-administered medications on file prior to visit.     BP 120/66   Pulse 65   Temp 98.1 F (36.7 C) (Oral)   Resp 16   Ht 5' 2"  (1.575 m)   Wt 169 lb 12.8 oz (77 kg)   LMP 12/30/2010   SpO2 100%   BMI 31.06 kg/m      Objective:   Physical Exam  General Mental Status- Alert. General Appearance- Not in acute distress.   Skin Rt side rib/lower axillary area I thinkg feel small lipoma. Other area anterior lower ribs below breast I don't feel small bumps pt feels. Neck Carotid Arteries- Normal color. Moisture- Normal Moisture. No carotid bruits. No JVD.  Chest and Lung Exam Auscultation: Breath Sounds:-Normal.  Cardiovascular Auscultation:Rythm- Regular. Murmurs & Other Heart  Sounds:Auscultation of the heart reveals- No Murmurs.  Abdomen Inspection:-Inspeection Normal. Palpation/Percussion:Note:No mass. Palpation and Percussion of the abdomen reveal- Non Tender, Non Distended + BS, no rebound or guarding.    Neurologic Cranial Nerve exam:- CN III-XII intact(No nystagmus),  symmetric smile. Drift Test:- No drift. Romberg Exam:- Negative.  Heal to Toe Gait exam:-Normal. Finger to Nose:- Normal/Intact Strength:- 5/5 equal and symmetric strength both upper and lower extremities.      Assessment & Plan:  For your history of GERD/reflux, I do want you to continue with omeprazole.  But I do also want you to get some famotidine/Pepcid over-the-counter to help control your reflux.  Some concern reflux might worsen with diclofenac written for your shoulder pain and xiphoid area pain.  For your right shoulder pain, I did place x-ray of your shoulder.  See if any chronic abnormality found that might give clue why you have daily pain.  For xiphoid area pain, also want you to use diclofenac.  Cartilage in this area probably is inflamed.  For caution sake we did EKG today.  EKG showed normal sinus rhythm.  No obvious changes compared to prior EKG.  If he were to get cardiac type signs or symptoms and recommend ED evaluation.  For your history of intermittent episodes of constipation with loose stools, I do want to get x-ray of your abdomen.  If your abdomen x-ray basically looks normal then would advise that you start Metamucil 1 rounded tablespoon in 8 ounces of water 3 times daily.  We will see if this normalizes bowel pattern.   You do report some small lumps on your body that are little bit painful on palpation.  1 area right upper rib/lower axillary area appeared to indicate possible small lipoma.  If this area becomes a little bit larger than I would recommend we refer you to general surgeon for evaluation and possible removal.  Often times when lipomas are on the small  size difficult to palpate and removed.  Also ultrasounds sometimes do not confirm small lipomas.  For history of hypothyroidism will get a TSH and T4 today.  Follow-up in 2 to 3 weeks or as needed.  40 minutes spent with patient today.  50% of time spent counseling patient on very complaints/concerns today.  Above the listed conditions.

## 2019-02-19 NOTE — Patient Instructions (Addendum)
For your history of GERD/reflux, I do want you to continue with omeprazole.  But I do also want you to get some famotidine/Pepcid over-the-counter to help control your reflux.  Some concern reflux might worsen with diclofenac written for your shoulder pain and xiphoid area pain.  For your right shoulder pain, I did place x-ray of your shoulder.  See if any chronic abnormality found that might give clue why you have daily pain.  For xiphoid area pain, also want you to use diclofenac.  Cartilage in this area probably is inflamed.  For caution sake we did EKG today.  EKG showed normal sinus rhythm.  No obvious changes compared to prior EKG(only slight nonspecific t wave abnormality t3 and t4).  If were to get cardiac type signs or symptoms and recommend ED evaluation. For caution sake got one troponin stat.  For your history of intermittent episodes of constipation with loose stools, I do want to get x-ray of your abdomen.  If your abdomen x-ray basically looks normal then would advise that you start Metamucil 1 rounded tablespoon in 8 ounces of water 3 times daily.  We will see if this normalizes bowel pattern.  You do report some small lumps on your body that are little bit painful on palpation.  1 area right upper rib/lower axillary area appeared to indicate possible small lipoma.  If this area becomes a little bit larger than I would recommend we refer you to general surgeon for evaluation and possible removal.  Often times when lipomas are on the small size difficult to palpate and removed.  Also ultrasounds sometimes do not confirm small lipomas.  For history of hypothyroidism will get a TSH and T4 today.  Offered could try generic penlac for possible nail fungus but if expensive advised against filing since location is on hand nails and fungus less likely.  Follow-up in 2 to 3 weeks or as needed.

## 2019-02-28 ENCOUNTER — Other Ambulatory Visit: Payer: Self-pay | Admitting: Medical

## 2019-04-05 ENCOUNTER — Other Ambulatory Visit: Payer: Self-pay | Admitting: Medical

## 2019-04-05 NOTE — Telephone Encounter (Signed)
Pt wants refill of diclofenac. But want virutal visit first. Want to discuss her area of pain. Also she has GI issues that want to discuss before rx nsaid.

## 2019-04-07 ENCOUNTER — Other Ambulatory Visit: Payer: Self-pay | Admitting: Medical

## 2019-04-13 NOTE — Telephone Encounter (Signed)
Lvm in spanish for patient to call for appointment as soon as she can

## 2019-05-06 ENCOUNTER — Other Ambulatory Visit: Payer: Self-pay | Admitting: Medical

## 2019-08-01 ENCOUNTER — Other Ambulatory Visit: Payer: Self-pay | Admitting: Medical

## 2019-08-26 ENCOUNTER — Telehealth: Payer: Self-pay

## 2019-08-26 NOTE — Telephone Encounter (Signed)
Pt is  Due for follow up please call to schedule appointment.

## 2019-08-30 ENCOUNTER — Telehealth: Payer: Self-pay

## 2019-08-30 NOTE — Telephone Encounter (Signed)
Copied from [place] [id number]. Topic: Appointment Scheduling - Transfer of Care >> Aug 30, 2019  3:23 PM Rain[place] A wrote: Pt is requesting to transfer FROM: Mackie Pai Pt is requesting to transfer AB:[id number] [place] Reason for requested transfer: Patient would feel more comfortable with female provider.

## 2019-08-30 NOTE — Telephone Encounter (Signed)
That is fine 

## 2019-08-31 NOTE — Telephone Encounter (Signed)
OK with me.

## 2019-08-31 NOTE — Telephone Encounter (Signed)
Spoke with spouse (Heliodoro on [place]) and schedule pt an appt on 09-10-2019. Done

## 2019-09-01 NOTE — Telephone Encounter (Signed)
Pt is schedule with Melissa on 09-11, pt is transferring care with Melissa.

## 2019-09-08 ENCOUNTER — Other Ambulatory Visit: Payer: Self-pay | Admitting: Medical

## 2019-09-10 ENCOUNTER — Other Ambulatory Visit: Payer: Self-pay

## 2019-09-10 ENCOUNTER — Ambulatory Visit (INDEPENDENT_AMBULATORY_CARE_PROVIDER_SITE_OTHER): Payer: BC Managed Care – PPO | Admitting: Family

## 2019-09-10 DIAGNOSIS — K219 Gastro-esophageal reflux disease without esophagitis: Secondary | ICD-10-CM | POA: Diagnosis not present

## 2019-09-10 DIAGNOSIS — E785 Hyperlipidemia, unspecified: Secondary | ICD-10-CM | POA: Diagnosis not present

## 2019-09-10 DIAGNOSIS — E119 Type 2 diabetes mellitus without complications: Secondary | ICD-10-CM | POA: Diagnosis not present

## 2019-09-10 DIAGNOSIS — R059 Cough, unspecified: Secondary | ICD-10-CM

## 2019-09-10 DIAGNOSIS — R05 Cough: Secondary | ICD-10-CM | POA: Diagnosis not present

## 2019-09-10 MED ORDER — LEVOTHYROXINE SODIUM 50 MCG PO TABS: ORAL_TABLET | ORAL | 1 refills | Status: DC

## 2019-09-10 MED ORDER — METFORMIN HCL 1000 MG PO TABS: 1000.0000 mg | ORAL_TABLET | Freq: Two times a day (BID) | ORAL | 1 refills | Status: DC

## 2019-09-10 MED ORDER — PANTOPRAZOLE SODIUM 40 MG PO TBEC: 40.0000 mg | DELAYED_RELEASE_TABLET | Freq: Every day | ORAL | 3 refills | Status: DC

## 2019-09-10 NOTE — Patient Instructions (Signed)
Stop omeprazole, start protonix.

## 2019-09-10 NOTE — Progress Notes (Signed)
Virtual Visit via Video Note  I connected with Bailey Harris on 09/10/19 at  8:20 AM EDT by a video enabled telemedicine application and verified that I am speaking with the correct person using two identifiers.  Location: Patient: parked car (husband assists with translation) Provider: work   I discussed the limitations of evaluation and management by telemedicine and the availability of in person appointments. The patient expressed understanding and agreed to proceed.  History of Present Illness:  Patient is a 58 yr old female who presents today in transfer from another provider in our clinic. She told front desk that he had a cough today so in person visit was transitioned to virtual due to covid-19 precaution protocol.  GERD- maintained on omeprazole. Reports symptoms are uncontrolled.  on PPI.    DM2-Not checking sugars at home.  Reports some mouth dryness. Continues metformin.    Lab Results  Component Value Date   HGBA1C 6.2 (H) 07/01/2018   HGBA1C 6.7 (H) 03/31/2018   HGBA1C 7.4 (H) 01/22/2018   Lab Results  Component Value Date   MICROALBUR 0.7 05/02/2016   LDLCALC 151 (H) 01/22/2018   CREATININE 0.55 07/01/2018   Hyperlipidemia- reports that she continues lipitor 56m. Notes some leg cramping.   Lab Results  Component Value Date   CHOL 217 (H) 01/22/2018   HDL 37.80 (L) 01/22/2018   LDLCALC 151 (H) 01/22/2018   TRIG 142.0 01/22/2018   CHOLHDL 6 01/22/2018   Cough- mild in the AM- she attributes this to her allergies and humidity. No fever.   Past Medical History:  Diagnosis Date  . Anemia    during pregnancy  . Diabetes mellitus [place] complication ([place]   . Fatty liver   . GERD (gastroesophageal reflux disease)   . Heart murmur   . HLD (hyperlipidemia)   . Hypothyroidism   . Pancreatic insufficiency    insulin irregularity  . Vitamin D deficiency      Social History   Socioeconomic History  . Marital status: Married    Spouse name: Not on  file  . Number of children: 3  . Years of education: Not on file  . Highest education level: Not on file  Occupational History  . Occupation: home maker  Social Needs  . Financial resource strain: Not on file  . Food insecurity    Worry: Not on file    Inability: Not on file  . Transportation needs    Medical: Not on file    Non-medical: Not on file  Tobacco Use  . Smoking status: Never Smoker  . Smokeless tobacco: Never Used  Substance and Sexual Activity  . Alcohol use: No    Alcohol/week: 0.0 standard drinks  . Drug use: No  . Sexual activity: Not on file  Lifestyle  . Physical activity    Days per week: Not on file    Minutes per session: Not on file  . Stress: Not on file  Relationships  . Social [place] phone: Not on file    Gets together: Not on file    Attends religious service: Not on file    Active member of club or organization: Not on file    Attends meetings of clubs or organizations: Not on file    Relationship status: Not on file  . Intimate partner violence    Fear of current or ex partner: Not on file    Emotionally abused: Not on file    Physically abused:  Not on file    Forced sexual activity: Not on file  Other Topics Concern  . 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 CESAREAN SECTION    . CHOLECYSTECTOMY N/A 03/31/2018   Procedure: LAPAROSCOPIC CHOLECYSTECTOMY WITH INTRAOPERATIVE CHOLANGIOGRAM;  Surgeon: Ralene Ok, MD;  Location: [facility];  Service: General;  Laterality: N/A;  . TUBAL LIGATION      Family History  Problem Relation Age of Onset  . Stomach cancer Mother        died at 59 from CA Mets  . CVA Father        died age 53  . CVA Sister         26- died  . CVA Brother        28 died  . Colon cancer Neg Hx   . Esophageal cancer Neg Hx   . Rectal cancer Neg Hx     No Known Allergies  Current Outpatient Medications on File Prior to Visit  Medication Sig Dispense Refill  . atorvastatin (LIPITOR) 40 MG tablet TOME UNA TABLETA TODOS LOS DIAS 90 tablet 3  . Calcium Carbonate Antacid (TUMS E-X PO) Take by mouth as needed.    . ciclopirox (PENLAC) 8 % solution Apply topically at bedtime. Apply over nail and surrounding skin. Apply daily over previous coat. After seven (7) days, may remove with alcohol and continue cycle. 6.6 mL 0  . diclofenac (VOLTAREN) 75 MG EC tablet TOME UNA TABLETA DOS VECES AL DIA 30 tablet 0  . fluticasone (FLONASE) 50 MCG/ACT nasal spray SPRAY 2 SPRAYS INTO EACH NOSTRIL TODOS LOS DIAS 16 g 1  . ibuprofen (ADVIL,MOTRIN) 200 MG tablet Take 200 mg by mouth every 6 (six) hours as needed.    Marland Kitchen omeprazole (PRILOSEC) 40 MG capsule TOME UNA CAPSULA TODOS LOS DIAS 90 capsule 2   No current facility-administered medications on file prior to visit.     LMP 12/30/2010       Observations/Objective:   Gen: Awake, alert, no acute distress Resp: Breathing is even and non-labored Psych: calm/pleasant demeanor Neuro: Alert and Oriented x 3, + facial symmetry, speech is clear.   Assessment and Plan:  GERD- uncontrolled. Will stop omeprazole, start protonix.  Discussed GERD diet.  DM2- control unknown. Obtain follow up A1C, urine microalbumin, Cr, continue metformin. She will return next week for flu shot.  Hyperlipidemia- mild leg pain on statin, not severe enough that she wishes to discontinue. Check follow up lipid panel.  Cough- mild, denies fever. Sounds clinically like mild allergies. Monitor.     Follow Up Instructions:    I discussed the assessment and treatment plan with the patient. The patient was provided an opportunity to ask questions and all were answered. The patient agreed with the  plan and demonstrated an understanding of the instructions.   The patient was advised to call back or seek an in-person evaluation if the symptoms worsen or if the condition fails to improve as anticipated.  Nance Pear, NP

## 2019-09-24 ENCOUNTER — Other Ambulatory Visit (INDEPENDENT_AMBULATORY_CARE_PROVIDER_SITE_OTHER): Payer: BC Managed Care – PPO

## 2019-09-24 ENCOUNTER — Other Ambulatory Visit: Payer: Self-pay | Admitting: Family Medicine

## 2019-09-24 ENCOUNTER — Ambulatory Visit: Payer: BC Managed Care – PPO

## 2019-09-24 ENCOUNTER — Other Ambulatory Visit: Payer: Self-pay

## 2019-09-24 DIAGNOSIS — E785 Hyperlipidemia, unspecified: Secondary | ICD-10-CM | POA: Diagnosis not present

## 2019-09-24 DIAGNOSIS — E119 Type 2 diabetes mellitus without complications: Secondary | ICD-10-CM

## 2019-09-24 DIAGNOSIS — Z23 Encounter for immunization: Secondary | ICD-10-CM

## 2019-09-24 LAB — LIPID PANEL
Cholesterol: 104 mg/dL (ref 0–200)
HDL: 34.2 mg/dL — ABNORMAL LOW (ref >39.00)
LDL Cholesterol: 45 mg/dL (ref 0–99)
NonHDL: 69.69
Total CHOL/HDL Ratio: 3
Triglycerides: 124 mg/dL (ref 0.0–149.0)
VLDL: 24.8 mg/dL (ref 0.0–40.0)

## 2019-09-24 LAB — MICROALBUMIN / CREATININE URINE RATIO
Creatinine,U: 206.5 mg/dL
Microalb Creat Ratio: 0.5 mg/g (ref 0.0–30.0)
Microalb, Ur: 1.1 mg/dL (ref 0.0–1.9)

## 2019-09-24 LAB — COMPREHENSIVE METABOLIC PANEL
ALT: 60 U/L — ABNORMAL HIGH (ref 0–35)
AST: 33 U/L (ref 0–37)
Albumin: 4.2 g/dL (ref 3.5–5.2)
Alkaline Phosphatase: 114 U/L (ref 39–117)
BUN: 12 mg/dL (ref 6–23)
CO2: 26 mEq/L (ref 19–32)
Calcium: 9.1 mg/dL (ref 8.4–10.5)
Chloride: 102 mEq/L (ref 96–112)
Creatinine, Ser: 0.55 mg/dL (ref 0.40–1.20)
GFR: 113.91 mL/min (ref >60.00)
Glucose, Bld: 134 mg/dL — ABNORMAL HIGH (ref 70–99)
Potassium: 4.1 mEq/L (ref 3.5–5.1)
Sodium: 139 mEq/L (ref 135–145)
Total Bilirubin: 1 mg/dL (ref 0.2–1.2)
Total Protein: 6.8 g/dL (ref 6.0–8.3)

## 2019-09-24 LAB — HEMOGLOBIN A1C: Hgb A1c MFr Bld: 8.1 % — ABNORMAL HIGH (ref 4.6–6.5)

## 2019-09-24 MED ORDER — PIOGLITAZONE HCL 30 MG PO TABS: 30.0000 mg | ORAL_TABLET | Freq: Every day | ORAL | 2 refills | Status: DC

## 2019-09-24 NOTE — Progress Notes (Signed)
Here for flu shot

## 2019-10-06 ENCOUNTER — Other Ambulatory Visit: Payer: Self-pay

## 2019-10-06 DIAGNOSIS — Z20822 Contact with and (suspected) exposure to covid-19: Secondary | ICD-10-CM

## 2019-10-06 DIAGNOSIS — Z20828 Contact with and (suspected) exposure to other viral communicable diseases: Secondary | ICD-10-CM | POA: Diagnosis not present

## 2019-10-07 ENCOUNTER — Telehealth: Payer: Self-pay

## 2019-10-07 NOTE — Telephone Encounter (Signed)
Patient's spouse, Heliodoro, called in requesting [facility] lab results - DOB/Address verified - verbal consent received - advised results pending. Assisted with MyChart setup, no further questions.

## 2019-10-13 ENCOUNTER — Telehealth: Payer: Self-pay | Admitting: Family

## 2019-10-13 ENCOUNTER — Ambulatory Visit (INDEPENDENT_AMBULATORY_CARE_PROVIDER_SITE_OTHER): Payer: BC Managed Care – PPO | Admitting: Family

## 2019-10-13 ENCOUNTER — Encounter: Payer: Self-pay | Admitting: Family

## 2019-10-13 DIAGNOSIS — U071 COVID-19: Secondary | ICD-10-CM | POA: Diagnosis not present

## 2019-10-13 LAB — NOVEL CORONAVIRUS, NAA: SARS-CoV-2, NAA: DETECTED — AB

## 2019-10-13 MED ORDER — AZITHROMYCIN 250 MG PO TABS: ORAL_TABLET | ORAL | 0 refills | Status: DC

## 2019-10-13 MED ORDER — BENZONATATE 100 MG PO CAPS: 100.0000 mg | ORAL_CAPSULE | Freq: Two times a day (BID) | ORAL | 0 refills | Status: DC | PRN

## 2019-10-13 NOTE — Telephone Encounter (Signed)
Advised patient of rx sent to her pharmacy.  Called [facility] for results and they were not able to find patient at all. Talking to the husband earlier today he reported testing center had the wrong year of birth and he told them to fix it. Asked [facility] to check under wrong date of birth and they found results. They will fix information in her records to indicate results. Her covid19 test is positive per [facility] tech verbal report. Patient was notified.

## 2019-10-13 NOTE — Telephone Encounter (Signed)
Please contact pt and let her know that I changed my mind and I decided to send an antibiotic for her. Rx has been sent to her pharmacy.

## 2019-10-13 NOTE — Progress Notes (Signed)
Virtual Visit via Video Note  I connected with Bailey Harris on 10/13/19 at  9:20 AM EDT by a video enabled telemedicine application and verified that I am speaking with the correct person using two identifiers.  Location: Patient: home Provider: home   I discussed the limitations of evaluation and management by telemedicine and the availability of in person appointments. The patient expressed understanding and agreed to proceed.  History of Present Illness:  Patient is a 58 yr old female who presents today with report of COVID-19 exposure. She reports that she attended church on 10/4 and later developed symptoms. Apparently there were approximately 90 attendees at that church service and everyone was wearing a mask. Per husband who helps with translation- about 36 percent of the people who attended the service have tested positive for COVID-19.  she She was tested on 10/7 (results are still pending).  Pt reports + back ache, cough, lung pain.  Intermittent bouts of shortness of breath. She denies associated nasal congestion. Does have loss of taste/smell and fatigue. No alleviating factors. Husband had a confirmed positive test.   Past Medical History:  Diagnosis Date  . Anemia    during pregnancy  . Diabetes mellitus without complication ([place])   . Fatty liver   . GERD (gastroesophageal reflux disease)   . Heart murmur   . HLD (hyperlipidemia)   . Hypothyroidism   . Pancreatic insufficiency    insulin irregularity  . Vitamin D deficiency      Social History   Socioeconomic History  . Marital status: Married    Spouse name: Not on file  . Number of children: 3  . Years of education: Not on file  . Highest education level: Not on file  Occupational History  . Occupation: home maker  Social Needs  . Financial resource strain: Not on file  . Food insecurity    Worry: Not on file    Inability: Not on file  . Transportation needs    Medical: Not on file    Non-medical: Not  on file  Tobacco Use  . Smoking status: Never Smoker  . Smokeless tobacco: Never Used  Substance and Sexual Activity  . Alcohol use: No    Alcohol/week: 0.0 standard drinks  . Drug use: No  . Sexual activity: Not on file  Lifestyle  . Physical activity    Days per week: Not on file    Minutes per session: Not on file  . Stress: Not on file  Relationships  . Social Herbalist on phone: Not on file    Gets together: Not on file    Attends religious service: Not on file    Active member of club or organization: Not on file    Attends meetings of clubs or organizations: Not on file    Relationship status: Not on file  . Intimate partner violence    Fear of current or ex partner: Not on file    Emotionally abused: Not on file    Physically abused: Not on file    Forced sexual activity: Not on file  Other Topics Concern  . 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 CESAREAN SECTION    . CHOLECYSTECTOMY N/A 03/31/2018   Procedure: LAPAROSCOPIC CHOLECYSTECTOMY WITH INTRAOPERATIVE CHOLANGIOGRAM;  Surgeon: Ralene Ok, MD;  Location: [facility];  Service: General;  Laterality: N/A;  . TUBAL LIGATION      Family History  Problem Relation Age of Onset  . Stomach cancer Mother        died at 17 from CA Mets  . CVA Father        died age 75  . CVA Sister        52- died  . CVA Brother        41 died  . Colon cancer Neg Hx   . Esophageal cancer Neg Hx   . Rectal cancer Neg Hx     No Known Allergies  Current Outpatient Medications on File Prior to Visit  Medication Sig Dispense Refill  . atorvastatin (LIPITOR) 40 MG tablet TOME UNA TABLETA TODOS LOS DIAS 90 tablet 3  . Calcium Carbonate  Antacid (TUMS E-X PO) Take by mouth as needed.    . ciclopirox (PENLAC) 8 % solution Apply topically at bedtime. Apply over nail and surrounding skin. Apply daily over previous coat. After seven (7) days, may remove with alcohol and continue cycle. 6.6 mL 0  . diclofenac (VOLTAREN) 75 MG EC tablet TOME UNA TABLETA DOS VECES AL DIA 30 tablet 0  . fluticasone (FLONASE) 50 MCG/ACT nasal spray SPRAY 2 SPRAYS INTO EACH NOSTRIL TODOS LOS DIAS 16 g 1  . ibuprofen (ADVIL,MOTRIN) 200 MG tablet Take 200 mg by mouth every 6 (six) hours as needed.    Marland Kitchen levothyroxine (SYNTHROID) 50 MCG tablet TOME UNA TABLETA TODOS LOS DIAS 90 tablet 1  . metFORMIN (GLUCOPHAGE) 1000 MG tablet Take 1 tablet (1,000 mg total) by mouth 2 (two) times daily with a meal. 180 tablet 1  . omeprazole (PRILOSEC) 40 MG capsule TOME UNA CAPSULA TODOS LOS DIAS 90 capsule 2  . pantoprazole (PROTONIX) 40 MG tablet Take 1 tablet (40 mg total) by mouth daily. 30 tablet 3  . pioglitazone (ACTOS) 30 MG tablet Take 1 tablet (30 mg total) by mouth daily. 30 tablet 2   No current facility-administered medications on file prior to visit.     LMP 12/30/2010      Observations/Objective:   Gen: Awake, tired appearing, no acute distress Resp: Breathing is even and non-labored, intermittent coarse cough is noted Psych: calm/pleasant demeanor Neuro: Alert and Oriented x 3, + facial symmetry, speech is clear.   Assessment and Plan:  Covid-19 infection-await formal results, but I expect them to be positive. Due to her c/o lung pain and cough, will rx with empiric azithromycin to cover for pneumonia. She is advised to go to the ER if she develops worsening symptoms including worsening shortness of breath. I will rx with tessalon prn cough.  Follow Up Instructions:    I discussed the assessment and treatment plan with the patient. The patient was provided an opportunity to ask questions and all were answered. The patient agreed with the plan and  demonstrated an understanding of the instructions.   The patient was advised to call back or seek an in-person evaluation if the symptoms worsen or if the condition fails to improve as anticipated.  Nance Pear, NP

## 2019-10-22 ENCOUNTER — Ambulatory Visit: Payer: BC Managed Care – PPO | Admitting: Family

## 2019-10-24 IMAGING — MG DIGITAL SCREENING BILATERAL MAMMOGRAM WITH TOMO AND CAD
8 series · 9 of 24 positions shown · non-contrast
Comparison: Previous exam(s).

CLINICAL DATA: Screening.

EXAM:
DIGITAL SCREENING BILATERAL MAMMOGRAM WITH TOMO AND CAD

[R MLO synth-2D]
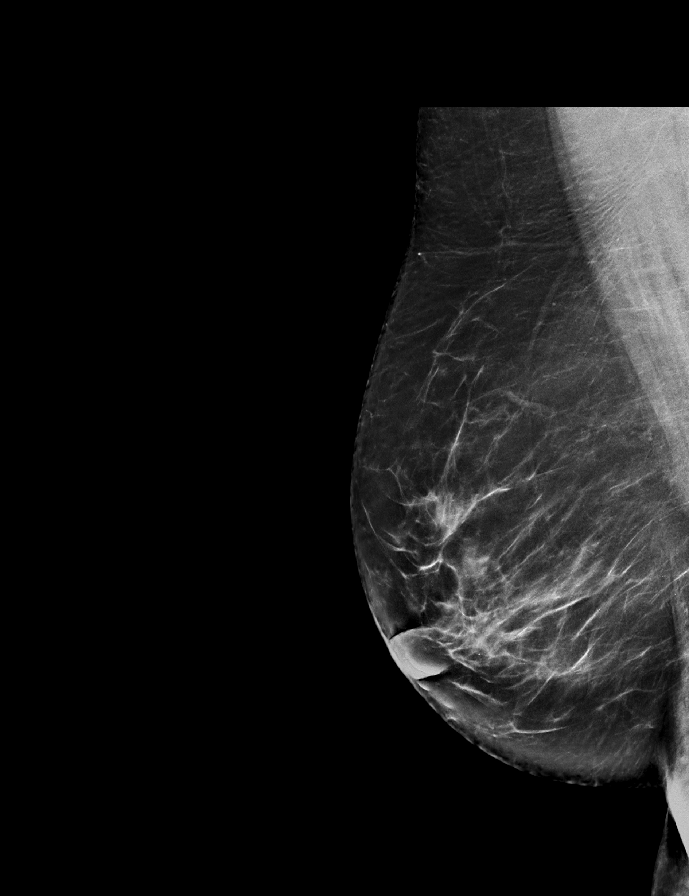

[L MLO synth-2D]
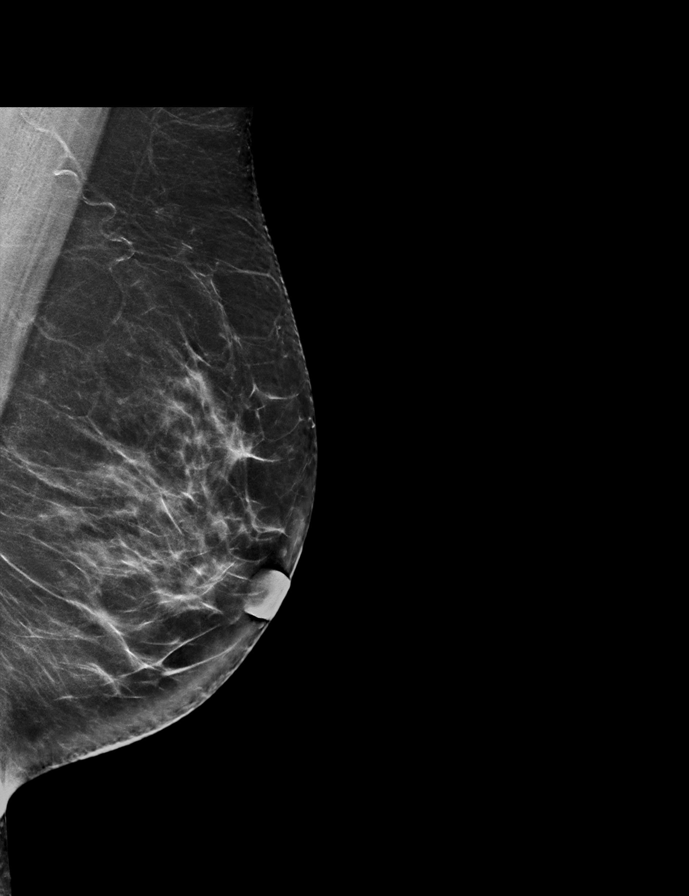

[L CC synth-2D]
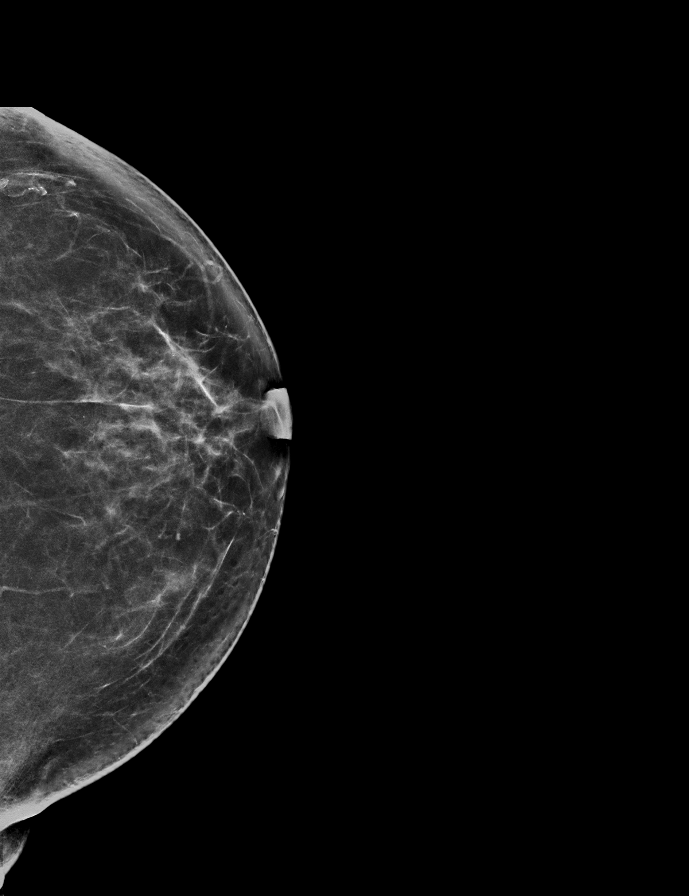

[R CC synth-2D]
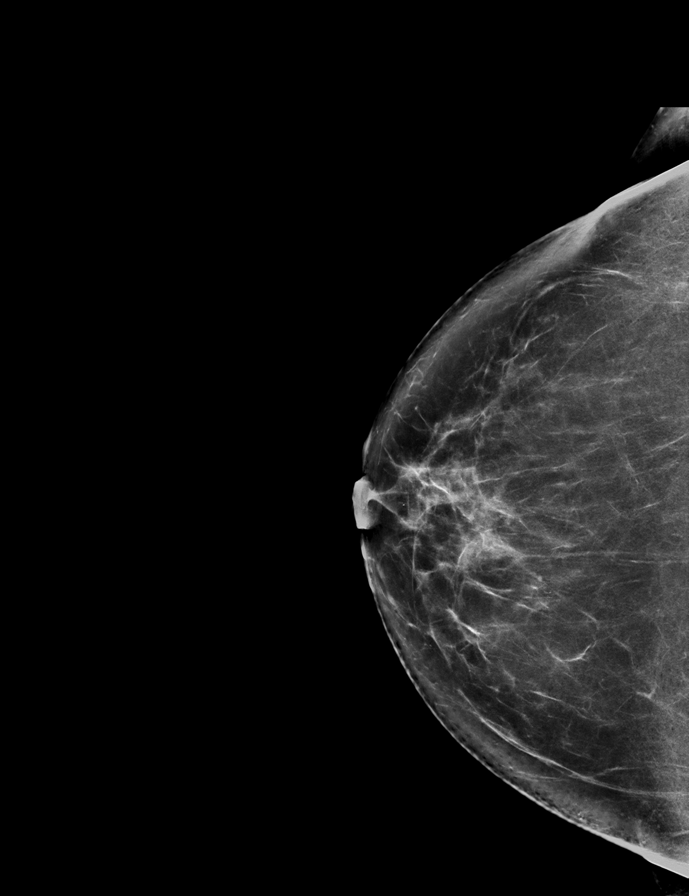

[L CC tomo · 2 of 75 frames shown]
[frame 25/75]
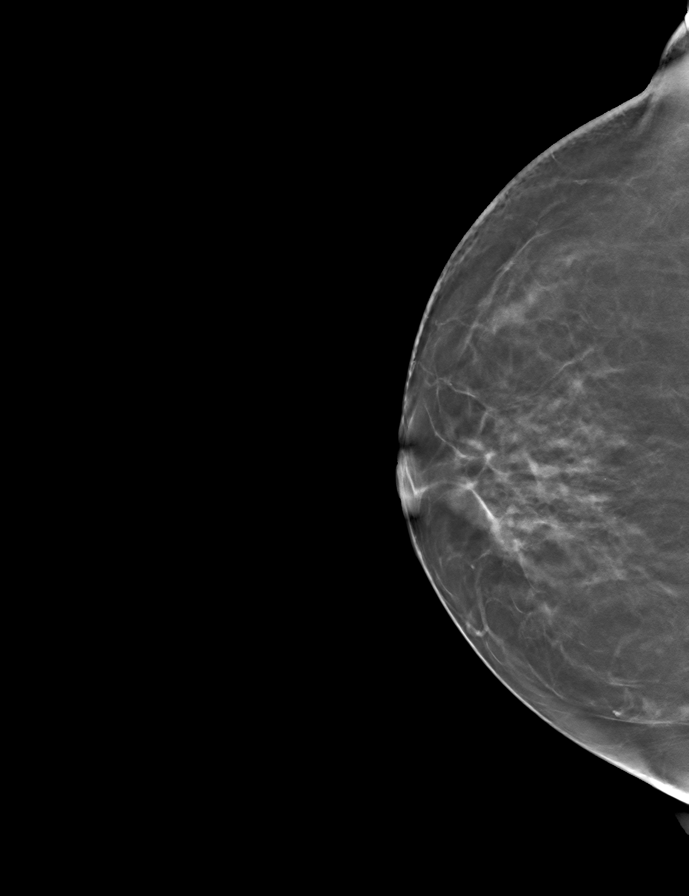
[frame 38/75]
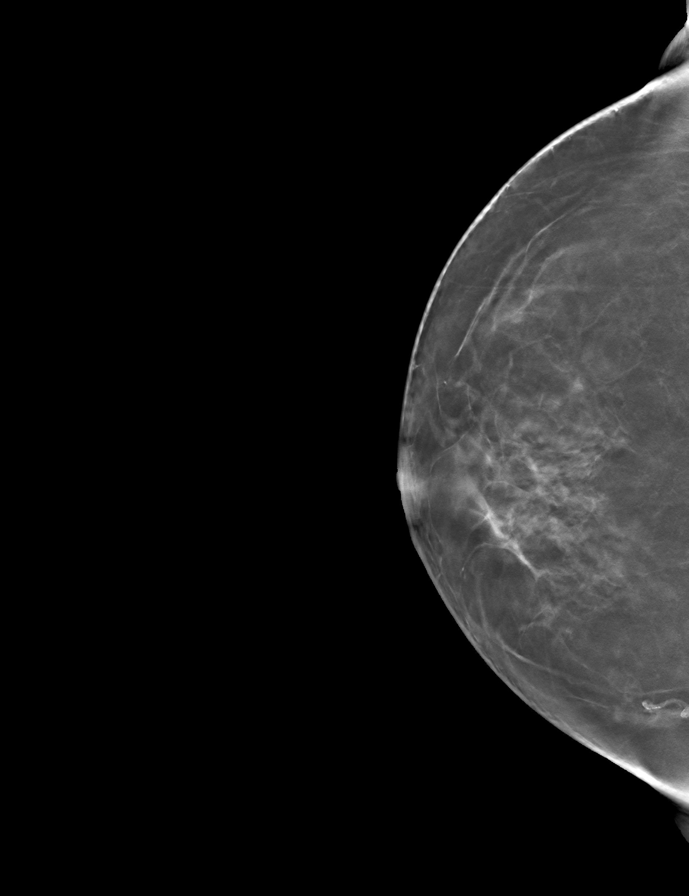

[R CC tomo · tomo slice 41/80.0]
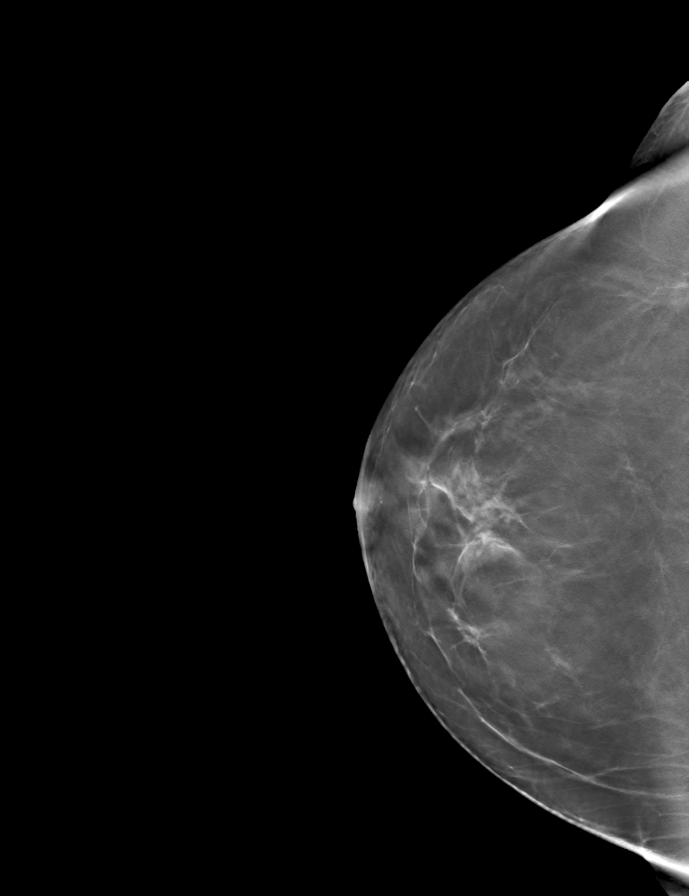

[L MLO tomo · tomo slice 39/77.0]
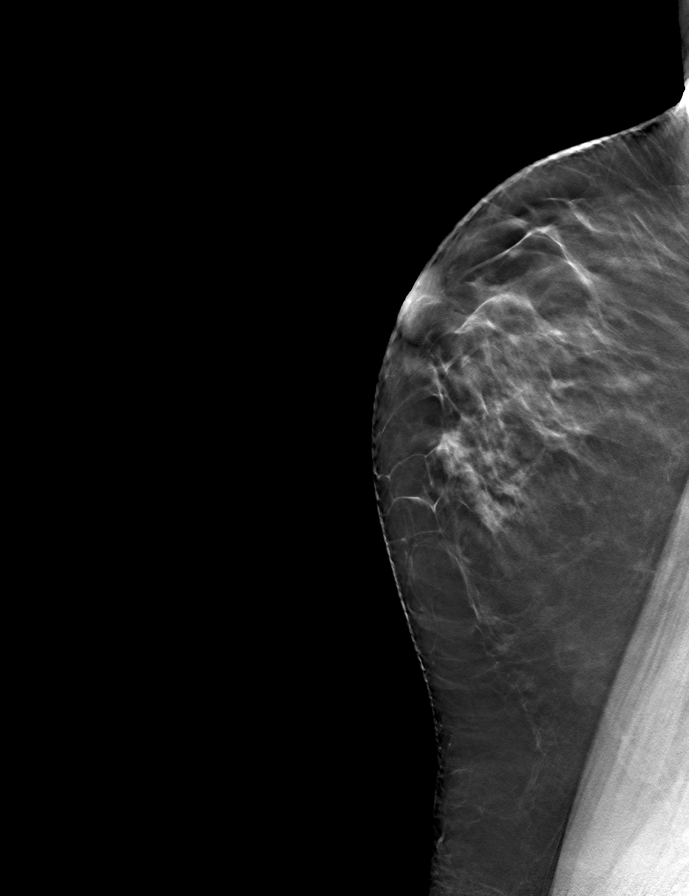

[R MLO tomo · tomo slice 41/81.0]
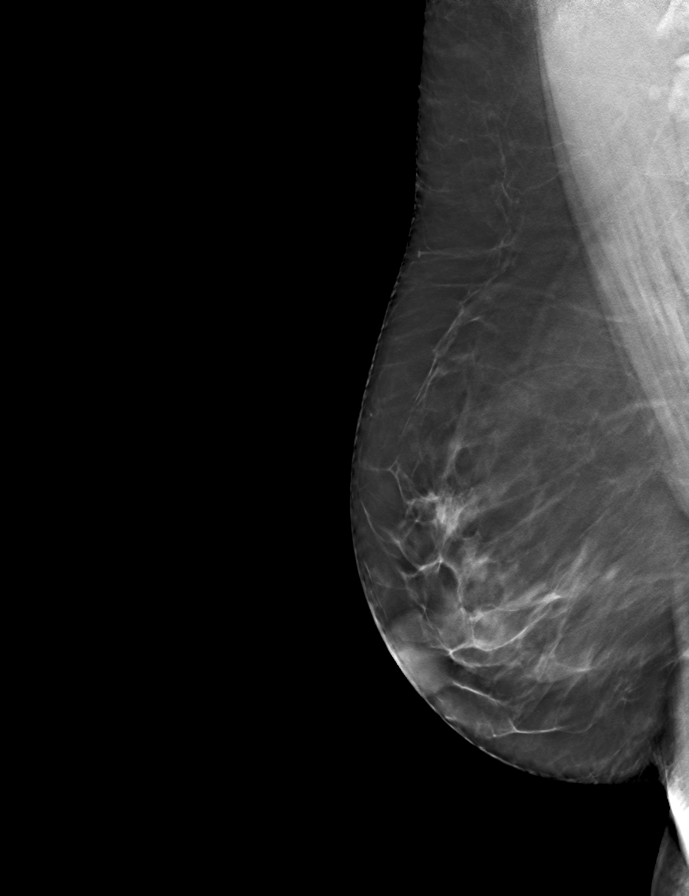

[9 of 24 positions shown; findings below may reference images not displayed]

ACR Breast Density Category b: There are scattered areas of
fibroglandular density.
FINDINGS: There are no findings suspicious for malignancy. Images were
processed with CAD.
IMPRESSION: No mammographic evidence of malignancy. A result letter of this
screening mammogram will be mailed directly to the patient.

RECOMMENDATION:
Screening mammogram in one year. (Code:[id number])

BI-RADS CATEGORY  1: Negative.

## 2019-12-14 ENCOUNTER — Other Ambulatory Visit: Payer: Self-pay | Admitting: Family Medicine

## 2019-12-16 ENCOUNTER — Other Ambulatory Visit: Payer: Self-pay

## 2019-12-17 ENCOUNTER — Ambulatory Visit (INDEPENDENT_AMBULATORY_CARE_PROVIDER_SITE_OTHER): Payer: BC Managed Care – PPO | Admitting: Family

## 2019-12-17 ENCOUNTER — Encounter: Payer: Self-pay | Admitting: Family

## 2019-12-17 VITALS — BP 125/66 | HR 63 | Temp 96.7°F | Resp 16 | Ht 63.0 in | Wt 159.0 lb

## 2019-12-17 DIAGNOSIS — Z23 Encounter for immunization: Secondary | ICD-10-CM

## 2019-12-17 DIAGNOSIS — Z0000 Encounter for general adult medical examination without abnormal findings: Secondary | ICD-10-CM

## 2019-12-17 DIAGNOSIS — E119 Type 2 diabetes mellitus without complications: Secondary | ICD-10-CM | POA: Diagnosis not present

## 2019-12-17 DIAGNOSIS — M8580 Other specified disorders of bone density and structure, unspecified site: Secondary | ICD-10-CM

## 2019-12-17 LAB — LIPID PANEL
Cholesterol: 98 mg/dL (ref 0–200)
HDL: 39.9 mg/dL (ref >39.00)
LDL Cholesterol: 45 mg/dL (ref 0–99)
NonHDL: 58.34
Total CHOL/HDL Ratio: 2
Triglycerides: 65 mg/dL (ref 0.0–149.0)
VLDL: 13 mg/dL (ref 0.0–40.0)

## 2019-12-17 LAB — CBC WITH DIFFERENTIAL/PLATELET
Basophils Absolute: 0 10*3/uL (ref 0.0–0.1)
Basophils Relative: 1 % (ref 0.0–3.0)
Eosinophils Absolute: 0.1 10*3/uL (ref 0.0–0.7)
Eosinophils Relative: 1.3 % (ref 0.0–5.0)
HCT: 38.1 % (ref 36.0–46.0)
Hemoglobin: 12.9 g/dL (ref 12.0–15.0)
Lymphocytes Relative: 29 % (ref 12.0–46.0)
Lymphs Abs: 1.4 10*3/uL (ref 0.7–4.0)
MCHC: 33.8 g/dL (ref 30.0–36.0)
MCV: 95 fl (ref 78.0–100.0)
Monocytes Absolute: 0.5 10*3/uL (ref 0.1–1.0)
Monocytes Relative: 9.7 % (ref 3.0–12.0)
Neutro Abs: 2.9 10*3/uL (ref 1.4–7.7)
Neutrophils Relative %: 59 % (ref 43.0–77.0)
Platelets: 248 10*3/uL (ref 150.0–400.0)
RBC: 4.01 Mil/uL (ref 3.87–5.11)
RDW: 12.7 % (ref 11.5–15.5)
WBC: 4.9 10*3/uL (ref 4.0–10.5)

## 2019-12-17 LAB — COMPREHENSIVE METABOLIC PANEL
ALT: 15 U/L (ref 0–35)
AST: 18 U/L (ref 0–37)
Albumin: 4.5 g/dL (ref 3.5–5.2)
Alkaline Phosphatase: 82 U/L (ref 39–117)
BUN: 13 mg/dL (ref 6–23)
CO2: 29 mEq/L (ref 19–32)
Calcium: 9.6 mg/dL (ref 8.4–10.5)
Chloride: 104 mEq/L (ref 96–112)
Creatinine, Ser: 0.57 mg/dL (ref 0.40–1.20)
GFR: 109.22 mL/min (ref >60.00)
Glucose, Bld: 110 mg/dL — ABNORMAL HIGH (ref 70–99)
Potassium: 4 mEq/L (ref 3.5–5.1)
Sodium: 140 mEq/L (ref 135–145)
Total Bilirubin: 0.7 mg/dL (ref 0.2–1.2)
Total Protein: 7.4 g/dL (ref 6.0–8.3)

## 2019-12-17 LAB — HEMOGLOBIN A1C: Hgb A1c MFr Bld: 6.3 % (ref 4.6–6.5)

## 2019-12-17 LAB — BASIC METABOLIC PANEL
BUN: 13 mg/dL (ref 6–23)
CO2: 29 mEq/L (ref 19–32)
Calcium: 9.6 mg/dL (ref 8.4–10.5)
Chloride: 104 mEq/L (ref 96–112)
Creatinine, Ser: 0.57 mg/dL (ref 0.40–1.20)
GFR: 109.22 mL/min (ref >60.00)
Glucose, Bld: 110 mg/dL — ABNORMAL HIGH (ref 70–99)
Potassium: 4 mEq/L (ref 3.5–5.1)
Sodium: 140 mEq/L (ref 135–145)

## 2019-12-17 LAB — TSH: TSH: 1.11 u[IU]/mL (ref 0.35–4.50)

## 2019-12-17 LAB — HEPATIC FUNCTION PANEL
ALT: 15 U/L (ref 0–35)
AST: 18 U/L (ref 0–37)
Albumin: 4.5 g/dL (ref 3.5–5.2)
Alkaline Phosphatase: 82 U/L (ref 39–117)
Bilirubin, Direct: 0.2 mg/dL (ref 0.0–0.3)
Total Bilirubin: 0.7 mg/dL (ref 0.2–1.2)
Total Protein: 7.4 g/dL (ref 6.0–8.3)

## 2019-12-17 MED ORDER — BLOOD GLUCOSE MONITOR KIT: PACK | 0 refills | Status: DC

## 2019-12-17 MED ORDER — MELOXICAM 7.5 MG PO TABS: 7.5000 mg | ORAL_TABLET | Freq: Every day | ORAL | 0 refills | Status: DC

## 2019-12-17 NOTE — Patient Instructions (Signed)
Please complete lab work prior to leaving.  Goal blood sugar 80-110 before breakfast and <140 two hours after a meal.   Cuidados preventivos en las mujeres de 80 a 57 aos de edad Preventive Care 46-57 Years Old, Female Los cuidados preventivos hacen referencia a las opciones en cuanto a las visitas al mdico y [place], las cuales pueden promover la salud y Musician. Esto puede comprender lo siguiente:  Un examen fsico anual. Esto tambin se puede llamar control de bienestar anual.  Visitas regulares al dentista y exmenes oculares.  Vacunas.  Estudios para Engineer, building services.  Opciones saludables de estilo de vida, como seguir una dieta saludable, hacer ejercicio regularmente, no usar drogas ni productos que contengan nicotina y tabaco, y limitar el consumo de alcohol. Qu puedo esperar para mi visita de cuidado preventivo? Examen fsico El mdico revisar lo siguiente:  IT consultant y [place]. Esto se puede usar para calcular el ndice de masa corporal ([place]), que indica si tiene un peso saludable.  Frecuencia cardaca y presin arterial.  Piel para detectar manchas anormales. Asesoramiento Su mdico puede preguntarle acerca de:  Consumo de tabaco, alcohol y drogas.  Su bienestar emocional.  El bienestar en el hogar y sus relaciones personales.  Su actividad sexual.  Sus hbitos de alimentacin.  Su trabajo y [place] laboral.  Mtodos anticonceptivos.  Su ciclo menstrual.  Sus antecedentes de Media planner. Qu vacunas necesito?  [place] antigripal  Se recomienda aplicarse esta vacuna todos los [place]. Vacuna contra el ttanos, difteria y tos ferina (Tdap)  Es posible que tenga que aplicarse un refuerzo contra el ttanos y la difteria (DT) cada 10aos. Vacuna contra la varicela  Es posible que tenga que aplicrsela si no recibi esta vacuna. Vacuna contra el herpes zster (culebrilla)  Es posible que la necesite despus de los 11 aos de  [place]. Vacuna contra el sarampin, rubola y paperas (SRP)  Es posible que necesite aplicarse al menos una dosis de la vacuna SRP si naci despus de [id number]. Podra tambin necesitar una segunda dosis. Vacuna antineumoccica conjugada (PCV13)  Puede necesitar esta vacuna si tiene determinadas enfermedades y no se vacun anteriormente. Edward Jolly antineumoccica de polisacridos (PPSV23)  Quizs tenga que aplicarse una o dos dosis si fuma o si tiene determinadas afecciones. Edward Jolly antimeningoccica conjugada (MenACWY)  Puede necesitar esta vacuna si tiene determinadas afecciones. Vacuna contra la hepatitis A  Es posible que necesite esta vacuna si tiene ciertas afecciones o si viaja o trabaja en lugares en los que podra estar expuesta a la hepatitis A. Vacuna contra la hepatitis B  Es posible que necesite esta vacuna si tiene ciertas afecciones o si viaja o trabaja en lugares en los que podra estar expuesta a la hepatitis B. Vacuna antihaemophilus influenzae tipo B (Hib)  Puede necesitar esta vacuna si tiene determinadas afecciones. Vacuna contra el virus del papiloma humano (VPH)  Si el mdico se lo recomienda, Research scientist (physical sciences) tres dosis a lo largo de 6 meses. Puede recibir las vacunas en forma de dosis individuales o en forma de dos o ms vacunas juntas en la misma inyeccin (vacunas combinadas). Hable con su mdico [employer] y beneficios de las vacunas combinadas. Qu pruebas necesito? Anlisis de [employer] de lpidos y colesterol. Estos se pueden verificar cada 5 aos o, con ms frecuencia, si usted tiene ms de 71 aos de edad.  Anlisis de hepatitisC.  Anlisis de hepatitisB. Pruebas de deteccin  Pruebas de deteccin de cncer de pulmn. Es  posible que se le realice esta prueba de deteccin a partir de los 75 aos de edad, si ha fumado durante 30 aos un paquete diario y sigue fumando o dej el hbito en algn momento en los ltimos 15 aos.  Pruebas de  Programme researcher, broadcasting/film/video de Surveyor, minerals. Todos los adultos a partir de los 57 aos de edad y [place] 57 aos de edad deben hacerse esta prueba de deteccin. El mdico puede recomendarle las pruebas de deteccin a partir de los 41 aos de edad si corre un mayor riesgo. Le realizarn pruebas cada 1 a 10 aos, segn los [place] y el tipo de prueba de Programme researcher, broadcasting/film/video.  Pruebas de deteccin de la diabetes. Esto se Set designer un control del azcar en la sangre (glucosa) despus de no haber comido durante un periodo de tiempo (ayuno). Es posible que se le realice esta prueba cada 1 a 3 aos.  Mamografa. Se puede realizar cada 1 o 2 aos. Hable con su mdico sobre cundo debe comenzar a Engineer, manufacturing de [place] regular. Esto depende de si tiene antecedentes familiares de cncer de mama o no.  Pruebas de deteccin de cncer relacionado con las mutaciones del BRCA. Es posible que se las deba realizar si tiene antecedentes de cncer de mama, de ovario, de trompas o peritoneal.  Examen plvico y prueba de Papanicolaou. Esto se puede realizar cada 57aos a Renato Gails de los 21aos de edad. A partir de los 30 aos, esto se puede Optometrist cada 5 aos si usted se realiza una prueba de Papanicolaou en combinacin con una prueba de deteccin del virus del papiloma humano (VPH). Otras pruebas  Anlisis de enfermedades de transmisin sexual (ETS).  Densitometra sea. Esto se realiza para detectar osteoporosis. Se le puede realizar este examen de deteccin si tiene un riesgo alto de tener osteoporosis. Siga estas instrucciones en su casa: Comida y bebida  Siga una dieta que incluya frutas frescas y verduras, cereales integrales, lcteos descremados y protenas magras.  Tome los suplementos vitamnicos y [employer] se lo haya indicado el mdico.  No beba alcohol si: ? Su mdico le indica no hacerlo. ? Est embarazada, puede estar embarazada o est tratando de quedar embarazada.  Si bebe alcohol: ? Limite  la cantidad que consume de 0 a 1 medida por da. ? Est atenta a la cantidad de alcohol que hay en las bebidas que toma. En los [place], una medida equivale a una botella de cerveza de 12oz (35m), un vaso de vino de 5oz (1453m o un vaso de una bebida alcohlica de alta graduacin de 1oz (4457m Estilo de vidNavistar International Corporationlas encas a diario.  Mantngase activa. Haga al menos 73m70mos de ejercicio 5o ms das Hilton Hotelso consuma ningn producto que contenga nicotina o tabaco, como cigarrillos, cigarrillos electrnicos y tabaco de mascHigher education careers adviser necesita ayuda para dejar de fumar, consulte al mdico.  Si es sexualmente activa, practique sexo seguro. Use un condn u otra forma de mtodo anticonceptivo (anticonceptivos) a fin de evitEnvironmental health practitionerTS (infecciones de transmisin sexual).  Si el mdico se lo indic, tome una dosis baja de aspirina diariamente a partir de los 50 a110 de edadHighlandsndo volver?  Visite al mdico una vez al ao para una visita de control.  Pregntele al mdico con qu frecuencia debe realizarse un control de la vista y los dientes.  Mantenga su esquema de vacunacin al da. Esta informacin no tiene comoPsychologist, clinical  consejo del mdico. Asegrese de hacerle al mdico cualquier pregunta que tenga. Document Released: 04/29/2017 Document Revised: 10/01/2018 Document Reviewed: 10/01/2018 Elsevier Patient Education  2020 [employer].

## 2019-12-17 NOTE — Progress Notes (Signed)
Subjective:    Patient ID: Bailey Harris, female    DOB: October 10, 1961, 58 y.o.   MRN: [id number]  HPI  Patient presents today for complete physical.  Immunizations: shingrix today, tetanus up to datet Diet:  Reports healthy diet at home Exercise: walks some.  Colonoscopy: 2019 Dexa: due Pap Smear: 2019 Mammogram: due Vsion: will schedule.   DM2- Lab Results  Component Value Date   HGBA1C 8.1 (H) 09/24/2019   HGBA1C 6.2 (H) 07/01/2018   HGBA1C 6.7 (H) 03/31/2018   Lab Results  Component Value Date   MICROALBUR 1.1 09/24/2019   LDLCALC 45 09/24/2019   CREATININE 0.55 09/24/2019     Review of Systems  Constitutional: Negative for unexpected weight change.  HENT: Negative for hearing loss and rhinorrhea.   Eyes: Negative for visual disturbance.  Respiratory: Negative for cough.   Cardiovascular: Negative for chest pain.  Gastrointestinal: Negative for constipation and diarrhea.  Genitourinary: Negative for dysuria and frequency.  Musculoskeletal:       Reports some upper back pain  Skin: Negative for rash.  Neurological: Positive for headaches (rare).  Hematological: Negative for adenopathy.  Psychiatric/Behavioral:       Denies depression/anxiety       Past Medical History:  Diagnosis Date  . Anemia    during pregnancy  . Diabetes mellitus without complication ([place])   . Fatty liver   . GERD (gastroesophageal reflux disease)   . Heart murmur   . HLD (hyperlipidemia)   . Hypothyroidism   . Pancreatic insufficiency    insulin irregularity  . Vitamin D deficiency      Social History   Socioeconomic History  . Marital status: Married    Spouse name: Not on file  . Number of children: 3  . Years of education: Not on file  . Highest education level: Not on file  Occupational History  . Occupation: home maker  Tobacco Use  . Smoking status: Never Smoker  . Smokeless tobacco: Never Used  Substance and Sexual Activity  . Alcohol use: No   Alcohol/week: 0.0 standard drinks  . Drug use: No  . Sexual activity: Not on file  Other Topics Concern  . 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 Difficulty of Paying Living Expenses: Not on file  Food Insecurity:   . Worried About Charity fundraiser in the Last Year: Not on file  . Ran Out of Food in the Last Year: Not on file  Transportation Needs:   . Lack of Transportation (Medical): Not on file  . Lack of Transportation (Non-Medical): Not on file  Physical Activity:   . Days of Exercise per Week: Not on file  . Minutes of Exercise per Session: Not on file  Stress:   . Feeling of Stress : Not on file  Social Connections:   . Frequency of Communication with Friends and Family: Not on file  . Frequency of Social Gatherings with Friends and Family: Not on file  . Attends Religious Services: Not on file  . Active Member of Clubs or Organizations: Not on file  . Attends  Club or Organization Meetings: Not on file  . Marital Status: Not on file  Intimate Partner Violence:   . Fear of Current or Ex-Partner: Not on file  . Emotionally Abused: Not on file  . Physically Abused: Not on file  . Sexually Abused: Not on file    Past Surgical History:  Procedure Laterality Date  . CESAREAN SECTION    . CHOLECYSTECTOMY N/A 03/31/2018   Procedure: LAPAROSCOPIC CHOLECYSTECTOMY WITH INTRAOPERATIVE CHOLANGIOGRAM;  Surgeon: Ralene Ok, MD;  Location: [facility];  Service: General;  Laterality: N/A;  . TUBAL LIGATION      Family History  Problem Relation Age of Onset  . Stomach cancer Mother        died at 29 from CA Mets  . CVA Father        died age 62  . CVA Sister        17- died  . CVA Brother         19 died  . Colon cancer Neg Hx   . Esophageal cancer Neg Hx   . Rectal cancer Neg Hx     No Known Allergies  Current Outpatient Medications on File Prior to Visit  Medication Sig Dispense Refill  . atorvastatin (LIPITOR) 40 MG tablet TOME UNA TABLETA TODOS LOS DIAS 90 tablet 3  . Calcium Carbonate Antacid (TUMS E-X PO) Take by mouth as needed.    . ciclopirox (PENLAC) 8 % solution Apply topically at bedtime. Apply over nail and surrounding skin. Apply daily over previous coat. After seven (7) days, may remove with alcohol and continue cycle. 6.6 mL 0  . diclofenac (VOLTAREN) 75 MG EC tablet TOME UNA TABLETA DOS VECES AL DIA 30 tablet 0  . fluticasone (FLONASE) 50 MCG/ACT nasal spray SPRAY 2 SPRAYS INTO EACH NOSTRIL TODOS LOS DIAS 16 g 1  . ibuprofen (ADVIL,MOTRIN) 200 MG tablet Take 200 mg by mouth every 6 (six) hours as needed.    Marland Kitchen levothyroxine (SYNTHROID) 50 MCG tablet TOME UNA TABLETA TODOS LOS DIAS 90 tablet 1  . metFORMIN (GLUCOPHAGE) 1000 MG tablet Take 1 tablet (1,000 mg total) by mouth 2 (two) times daily with a meal. 180 tablet 1  . omeprazole (PRILOSEC) 40 MG capsule TOME UNA CAPSULA TODOS LOS DIAS 90 capsule 2  . pantoprazole (PROTONIX) 40 MG tablet Take 1 tablet (40 mg total) by mouth daily. 30 tablet 3  . pioglitazone (ACTOS) 30 MG tablet Take 1 tablet (30 mg total) by mouth daily. 30 tablet 2   No current facility-administered medications on file prior to visit.    BP 125/66 (BP Location: Right Arm, Patient Position: Sitting, Cuff Size: Large)   Pulse 63   Temp (!) 96.7 F (35.9 C) (Temporal)   Resp 16   Ht 5' 3"  (1.6 m)   Wt 159 lb (72.1 kg)   LMP 12/30/2010   SpO2 99%   BMI 28.17 kg/m    Objective:   Physical Exam Physical Exam  Constitutional: She is oriented to person, place, and time. She appears well-developed and well-nourished. No distress.  HENT:  Head: Normocephalic and atraumatic.  Right Ear: Tympanic membrane and ear canal normal.  Left  Ear: Tympanic membrane and ear canal normal.  Mouth/Throat: Not examined- pt wearing mask Eyes: Pupils are equal, round, and reactive to light. No scleral icterus.  Neck: Normal range of motion. No thyromegaly present.  Cardiovascular: Normal rate and regular rhythm.   No murmur heard. Pulmonary/Chest: Effort normal and breath sounds  normal. No respiratory distress. He has no wheezes. She has no rales. She exhibits no tenderness.  Abdominal: Soft. Bowel sounds are normal. She exhibits no distension and no mass. There is no tenderness. There is no rebound and no guarding.  Musculoskeletal: She exhibits no edema.  Lymphadenopathy:    She has no cervical adenopathy.  Neurological: She is alert and oriented to person, place, and time. She exhibits normal muscle tone. Coordination normal.  Skin: Skin is warm and dry.  Psychiatric: She has a normal mood and affect. Her behavior is normal. Judgment and thought content normal.  Breast/pelvic: deferred         Assessment & Plan:    Preventive care-discussed healthy diet, exercise.  Flu shot up-to-date.  Shingrix No. 1 given today.  Pap smear and colonoscopy are up-to-date. Due for bone density. Obtain routine lab work.  Diabetes type 2-she does not have a glucometer.  We will give her a glucometer rx to check her sugars.  Reviewed glycemic goals.  This visit occurred during the SARS-CoV-2 public health emergency.  Safety protocols were in place, including screening questions prior to the visit, additional usage of staff PPE, and extensive cleaning of exam room while observing appropriate contact time as indicated for disinfecting solutions.       Assessment & Plan:

## 2019-12-27 ENCOUNTER — Ambulatory Visit (HOSPITAL_BASED_OUTPATIENT_CLINIC_OR_DEPARTMENT_OTHER): Payer: BC Managed Care – PPO

## 2019-12-27 ENCOUNTER — Other Ambulatory Visit (HOSPITAL_BASED_OUTPATIENT_CLINIC_OR_DEPARTMENT_OTHER): Payer: BC Managed Care – PPO

## 2019-12-28 ENCOUNTER — Ambulatory Visit (HOSPITAL_BASED_OUTPATIENT_CLINIC_OR_DEPARTMENT_OTHER)
Admission: RE | Admit: 2019-12-28 | Discharge: 2019-12-28 | Disposition: A | Discharge status: Home / Self Care | Payer: BC Managed Care – PPO | Source: Ambulatory Visit | Attending: Family | Admitting: Family

## 2019-12-28 ENCOUNTER — Encounter (HOSPITAL_BASED_OUTPATIENT_CLINIC_OR_DEPARTMENT_OTHER): Payer: Self-pay

## 2019-12-28 ENCOUNTER — Encounter: Payer: Self-pay | Admitting: Family

## 2019-12-28 ENCOUNTER — Other Ambulatory Visit: Payer: Self-pay

## 2019-12-28 DIAGNOSIS — M8580 Other specified disorders of bone density and structure, unspecified site: Secondary | ICD-10-CM | POA: Insufficient documentation

## 2019-12-28 DIAGNOSIS — Z1231 Encounter for screening mammogram for malignant neoplasm of breast: Secondary | ICD-10-CM | POA: Diagnosis not present

## 2019-12-28 DIAGNOSIS — Z0000 Encounter for general adult medical examination without abnormal findings: Secondary | ICD-10-CM | POA: Insufficient documentation

## 2019-12-28 DIAGNOSIS — M8588 Other specified disorders of bone density and structure, other site: Secondary | ICD-10-CM | POA: Diagnosis not present

## 2019-12-28 HISTORY — DX: Other specified disorders of bone density and structure, unspecified site: M85.80

## 2020-01-08 ENCOUNTER — Other Ambulatory Visit: Payer: Self-pay | Admitting: Family

## 2020-01-17 ENCOUNTER — Other Ambulatory Visit: Payer: Self-pay | Admitting: Family

## 2020-01-17 DIAGNOSIS — E119 Type 2 diabetes mellitus without complications: Secondary | ICD-10-CM

## 2020-02-16 ENCOUNTER — Other Ambulatory Visit: Payer: Self-pay

## 2020-02-16 ENCOUNTER — Encounter: Payer: Self-pay | Admitting: Family

## 2020-02-16 ENCOUNTER — Ambulatory Visit (INDEPENDENT_AMBULATORY_CARE_PROVIDER_SITE_OTHER): Payer: BC Managed Care – PPO | Admitting: Family

## 2020-02-16 ENCOUNTER — Telehealth: Payer: Self-pay | Admitting: Family

## 2020-02-16 DIAGNOSIS — M2550 Pain in unspecified joint: Secondary | ICD-10-CM | POA: Diagnosis not present

## 2020-02-16 MED ORDER — LEVOTHYROXINE SODIUM 50 MCG PO TABS: ORAL_TABLET | ORAL | 1 refills | Status: DC

## 2020-02-16 MED ORDER — ATORVASTATIN CALCIUM 40 MG PO TABS: ORAL_TABLET | ORAL | 1 refills | Status: DC

## 2020-02-16 MED ORDER — METFORMIN HCL 1000 MG PO TABS: 1000.0000 mg | ORAL_TABLET | Freq: Two times a day (BID) | ORAL | 1 refills | Status: DC

## 2020-02-16 MED ORDER — DICLOFENAC SODIUM 75 MG PO TBEC: DELAYED_RELEASE_TABLET | ORAL | 0 refills | Status: DC

## 2020-02-16 NOTE — Progress Notes (Signed)
Virtual Visit via Telephone Note  I connected with Bailey Harris on 02/16/20 at 12:40 PM EST by telephone and verified that I am speaking with the correct person using two identifiers.  Location: Patient: home Provider: home   I discussed the limitations, risks, security and privacy concerns of performing an evaluation and management service by telephone and the availability of in person appointments. I also discussed with the patient that there may be a patient responsible charge related to this service. The patient expressed understanding and agreed to proceed.   History of Present Illness:  Bailey Harris language resources interpreted for today's visit.   Patient reports 12/23 developed pain in the shoulder, using multiple meds (aleve/tylenol). Sometimes pain is in the elbow to the wrist which has been hurting her a lot.  Denies associated neck pain. Reports pain is 8/10.     Reports that she is also experiencing pain in the left hand and left wrist.  She is right hand dominant.      Observations/Objective:   Gen: Awake, alert Resp: Breathing sounds even and non-labored Psych: calm/pleasant demeanor Neuro: Alert and Oriented x 3, + facial symmetry, speech is clear.  Assessment and Plan:  Joint pain- will refer to sports medicine for further evaluation. In the meantime, will send rx for diclofenac.    She will be due for follow up in the end of March.   Follow Up Instructions:  Below are two ways to schedule a Covid-19 Vaccine:  Please visit [email or web address] to register or call [phone]  Or call:  [facility] Covid-19 vaccine scheduling at [phone]  I discussed the assessment and treatment plan with the patient. The patient was provided an opportunity to ask questions and all were answered. The patient agreed with the plan and demonstrated an understanding of the instructions.   The patient was advised to call back or seek an in-person  evaluation if the symptoms worsen or if the condition fails to improve as anticipated.  I provided 15 minutes of non-face-to-face time during this encounter.   Nance Pear, NP

## 2020-02-16 NOTE — Telephone Encounter (Signed)
See my chart message

## 2020-02-21 ENCOUNTER — Other Ambulatory Visit: Payer: Self-pay

## 2020-02-21 ENCOUNTER — Encounter: Payer: Self-pay | Admitting: Family Medicine

## 2020-02-21 ENCOUNTER — Ambulatory Visit: Payer: Self-pay

## 2020-02-21 ENCOUNTER — Ambulatory Visit: Payer: BC Managed Care – PPO | Admitting: Family Medicine

## 2020-02-21 VITALS — BP 122/80 | HR 71 | Ht 63.0 in | Wt 159.0 lb

## 2020-02-21 DIAGNOSIS — M7711 Lateral epicondylitis, right elbow: Secondary | ICD-10-CM

## 2020-02-21 DIAGNOSIS — M67431 Ganglion, right wrist: Secondary | ICD-10-CM | POA: Insufficient documentation

## 2020-02-21 DIAGNOSIS — M25531 Pain in right wrist: Secondary | ICD-10-CM

## 2020-02-21 DIAGNOSIS — G5621 Lesion of ulnar nerve, right upper limb: Secondary | ICD-10-CM | POA: Diagnosis not present

## 2020-02-21 MED ORDER — PREDNISONE 5 MG PO TABS: ORAL_TABLET | ORAL | 0 refills | Status: DC

## 2020-02-21 NOTE — Patient Instructions (Signed)
Nice to meet you Please try the prednisone and check your sugars. Let us know if they are too high  Please try the pennsaid after the prednisone  Please try the exercises when the pain has improved.  Please try ice as needed  Please send me a message in MyChart with any questions or updates.  Please see me back in 4 weeks.   --Dr. Raeford Razor

## 2020-02-21 NOTE — Assessment & Plan Note (Signed)
Seems more predominant on the right than the left. Exacerbated with certain movements. -Prednisone. Counseled to watch her sugar. -Counseled on home exercise therapy and supportive care. -Pennsaid samples. -Could consider physical therapy or injection.

## 2020-02-21 NOTE — Assessment & Plan Note (Signed)
Has some symptoms of cubital tunnel syndrome as well. -Counseled on exercise therapy and supportive care. -Could consider physical therapy or injection.

## 2020-02-21 NOTE — Progress Notes (Signed)
Medication Samples have been provided to the patient.  Drug name: Pennsaid     Strength: 2%        Qty: 2 Boxes  LOTHX:[id number]  Exp.Date: 03/2020  Dosing instructions: Use a pea size amount and rub gently.  The patient has been instructed regarding the correct time, dose, and frequency of taking this medication, including desired effects and most common side effects.   Sherrie George, [place] 12:25 PM 02/21/2020

## 2020-02-21 NOTE — Progress Notes (Signed)
Bailey Harris - 60 y.o. female MRN [id number]  Date of birth: 01/12/1961  SUBJECTIVE:  Including CC & ROS.  Chief Complaint  Patient presents with  . Wrist Pain    bilateral wrist  . Elbow Pain    right elbow    Bailey Harris is a 59 y.o. female that is presenting with right elbow pain and left elbow pain. She also noticed a problem on the volar aspect of the wrist. The symptoms have been occurring for the 2 to 3 months. She notices the pain over the lateral epicondyles with some radiation distally. It is worse with certain movements. He also has some pain around the cubital tunnel that radiates distally. She also has symptoms on the left side that are similar manner. She denies any specific repetitive motion. She does housework which seem to exacerbate the pain. She also notices a cyst on the volar aspect of the wrist.   Review of Systems See HPI   HISTORY: Past Medical, Surgical, Social, and Family History Reviewed & Updated per EMR.   Pertinent Historical Findings include:  Past Medical History:  Diagnosis Date  . Anemia    during pregnancy  . Diabetes mellitus without complication ([place])   . Fatty liver   . GERD (gastroesophageal reflux disease)   . Heart murmur   . HLD (hyperlipidemia)   . Hypothyroidism   . Osteopenia   . Pancreatic insufficiency    insulin irregularity  . Vitamin D deficiency     Past Surgical History:  Procedure Laterality Date  . CESAREAN SECTION    . CHOLECYSTECTOMY N/A 03/31/2018   Procedure: LAPAROSCOPIC CHOLECYSTECTOMY WITH INTRAOPERATIVE CHOLANGIOGRAM;  Surgeon: Ralene Ok, MD;  Location: [facility];  Service: General;  Laterality: N/A;  . TUBAL LIGATION      Family History  Problem Relation Age of Onset  . Stomach cancer Mother        died at 54 from CA Mets  . CVA Father        died age 72  . CVA Sister        28- died  . CVA Brother        90 died  . Colon cancer Neg Hx   . Esophageal cancer Neg Hx   . Rectal cancer Neg Hx      Social History   Socioeconomic History  . Marital status: Married    Spouse name: Not on file  . Number of children: 3  . Years of education: Not on file  . Highest education level: Not on file  Occupational History  . Occupation: home maker  Tobacco Use  . Smoking status: Never Smoker  . Smokeless tobacco: Never Used  Substance and Sexual Activity  . Alcohol use: No    Alcohol/week: 0.0 standard drinks  . Drug use: No  . Sexual activity: Not on file  Other Topics Concern  . Not on file  Social History Narrative   Married   3 children daughter- 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 Difficulty of Paying Living Expenses: Not on file  Food Insecurity:   . Worried About Charity fundraiser in the Last Year: Not on file  .  Ran Out of Food in the Last Year: Not on file  Transportation Needs:   . Lack of Transportation (Medical): Not on file  . Lack of Transportation (Non-Medical): Not on file  Physical Activity:   . Days of Exercise per Week: Not on file  . Minutes of Exercise per Session: Not on file  Stress:   . Feeling of Stress : Not on file  Social Connections:   . Frequency of Communication with Friends and Family: Not on file  . Frequency of Social Gatherings with Friends and Family: Not on file  . Attends Religious Services: Not on file  . Active Member of Clubs or Organizations: Not on file  . Attends Archivist Meetings: Not on file  . Marital Status: Not on file  Intimate Partner Violence:   . Fear of Current or Ex-Partner: Not on file  . Emotionally Abused: Not on file  . Physically Abused: Not on file  . Sexually Abused: Not on file     PHYSICAL EXAM:  VS: BP 122/80   Pulse 71   Ht 5' 3"  (1.6 m)   Wt 159 lb (72.1 kg)    LMP 12/30/2010   BMI 28.17 kg/m  Physical Exam Gen: NAD, alert, cooperative with exam, well-appearing MSK:  Right elbow: No ecchymosis or swelling. Normal range of motion. Tenderness to palpation over the lateral condyle. Positive Tinel's at the elbow. Pain with resistance of middle finger extension. Normal grip strength. No signs of atrophy. Right wrist: Cyst appearing over the volar aspect. No redness  Normal range of motion. Neurovascularly intact  Limited ultrasound: Right wrist:  There is a cystic structure that is accompanying the flexor carpi radialis near the insertion. There is no hyperemia in this area.  Summary: Cyst encompassing the flexor carpi radialis.  Ultrasound and interpretation by Clearance Coots, MD    ASSESSMENT & PLAN:   Ganglion cyst of volar aspect of right wrist She experiences some pain with this area. It does encircle the tendon. -Counseled supportive care. -Can consider aspiration.  Lateral epicondylitis of right elbow Seems more predominant on the right than the left. Exacerbated with certain movements. -Prednisone. Counseled to watch her sugar. -Counseled on home exercise therapy and supportive care. -Pennsaid samples. -Could consider physical therapy or injection.  Ulnar neuropathy at elbow, right Has some symptoms of cubital tunnel syndrome as well. -Counseled on exercise therapy and supportive care. -Could consider physical therapy or injection.

## 2020-02-21 NOTE — Assessment & Plan Note (Signed)
She experiences some pain with this area. It does encircle the tendon. -Counseled supportive care. -Can consider aspiration.

## 2020-02-23 ENCOUNTER — Ambulatory Visit: Payer: BC Managed Care – PPO

## 2020-03-11 ENCOUNTER — Other Ambulatory Visit: Payer: Self-pay | Admitting: Family

## 2020-03-16 ENCOUNTER — Other Ambulatory Visit: Payer: Self-pay

## 2020-03-17 ENCOUNTER — Ambulatory Visit: Payer: BC Managed Care – PPO | Admitting: Family

## 2020-03-20 ENCOUNTER — Ambulatory Visit: Payer: BC Managed Care – PPO | Admitting: Family Medicine

## 2020-04-07 ENCOUNTER — Ambulatory Visit: Payer: BC Managed Care – PPO

## 2020-04-11 ENCOUNTER — Encounter: Payer: Self-pay | Admitting: Family

## 2020-04-11 ENCOUNTER — Other Ambulatory Visit: Payer: Self-pay

## 2020-04-11 ENCOUNTER — Ambulatory Visit: Payer: BC Managed Care – PPO | Admitting: Family

## 2020-04-11 VITALS — BP 136/79 | HR 68 | Temp 97.4°F | Resp 16 | Ht 63.0 in | Wt 158.0 lb

## 2020-04-11 DIAGNOSIS — E119 Type 2 diabetes mellitus without complications: Secondary | ICD-10-CM

## 2020-04-11 DIAGNOSIS — Z23 Encounter for immunization: Secondary | ICD-10-CM

## 2020-04-11 DIAGNOSIS — M79631 Pain in right forearm: Secondary | ICD-10-CM | POA: Diagnosis not present

## 2020-04-11 DIAGNOSIS — K219 Gastro-esophageal reflux disease without esophagitis: Secondary | ICD-10-CM | POA: Diagnosis not present

## 2020-04-11 MED ORDER — DICLOFENAC SODIUM 75 MG PO TBEC: 75.0000 mg | DELAYED_RELEASE_TABLET | Freq: Two times a day (BID) | ORAL | 0 refills | Status: DC | PRN

## 2020-04-11 NOTE — Patient Instructions (Signed)
Begin diclofenac twice daily as needed for wrist/foot pain.  Call if symptoms worsen or if not improved in the next few weeks.

## 2020-04-11 NOTE — Progress Notes (Signed)
Subjective:    Patient ID: Bailey Harris, female    DOB: [birth year], 59 y.o.   MRN: [id number]  HPI Patient is a 59 year old female who presents today for routine follow-up.  Marin Roberts CMA assists with Spanish translation.  DM2- she is checking sugars at home. Reports that her sugars around 101.   Lab Results  Component Value Date   HGBA1C 6.3 12/17/2019   HGBA1C 8.1 (H) 09/24/2019   HGBA1C 6.2 (H) 07/01/2018   Lab Results  Component Value Date   MICROALBUR 1.1 09/24/2019   LDLCALC 45 12/17/2019   CREATININE 0.57 12/17/2019   CREATININE 0.57 12/17/2019   Lab Results  Component Value Date   TSH 1.11 12/17/2019   GERD- reports stable on omeprazole prn.    She does report some pain and tenderness in her right forearm.  This began after she started doing frequent crocheting.  Hypothyroid-maintained on Synthroid.  Review of Systems See HPI  Past Medical History:  Diagnosis Date  . Anemia    during pregnancy  . Diabetes mellitus without complication ([place])   . Fatty liver   . GERD (gastroesophageal reflux disease)   . Heart murmur   . HLD (hyperlipidemia)   . Hypothyroidism   . Osteopenia   . Pancreatic insufficiency    insulin irregularity  . Vitamin D deficiency      Social History   Socioeconomic History  . Marital status: Married    Spouse name: Not on file  . Number of children: 3  . Years of education: Not on file  . Highest education level: Not on file  Occupational History  . Occupation: home maker  Tobacco Use  . Smoking status: Never Smoker  . Smokeless tobacco: Never Used  Substance and Sexual Activity  . Alcohol use: No    Alcohol/week: 0.0 standard drinks  . Drug use: No  . Sexual activity: Not on file  Other Topics Concern  . 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 Difficulty of Paying Living Expenses:   Food Insecurity:   . Worried About Charity fundraiser in the Last Year:   . Arboriculturist in the Last Year:   Transportation Needs:   . Film/video editor (Medical):   Marland Kitchen Lack of Transportation (Non-Medical):   Physical Activity:   . Days of Exercise per Week:   . Minutes of Exercise per Session:   Stress:   . Feeling of Stress :   Social Connections:   . Frequency of Communication with Friends and Family:   . Frequency of Social Gatherings with Friends and Family:   . Attends Religious Services:   . Active Member of Clubs or Organizations:   . Attends Archivist Meetings:   Marland Kitchen Marital Status:   Intimate Partner Violence:   . Fear of Current or Ex-Partner:   . Emotionally Abused:   Marland Kitchen Physically Abused:   . Sexually Abused:     Past Surgical History:  Procedure Laterality Date  . CESAREAN SECTION    . CHOLECYSTECTOMY N/A 03/31/2018   Procedure: LAPAROSCOPIC CHOLECYSTECTOMY WITH  INTRAOPERATIVE CHOLANGIOGRAM;  Surgeon: Ralene Ok, MD;  Location: [facility] OR;  Service: General;  Laterality: N/A;  . TUBAL LIGATION      Family History  Problem Relation Age of Onset  . Stomach cancer Mother        died at 12 from CA Mets  . CVA Father        died age 20  . CVA Sister        44- died  . CVA Brother        70 died  . Colon cancer Neg Hx   . Esophageal cancer Neg Hx   . Rectal cancer Neg Hx     No Known Allergies  Current Outpatient Medications on File Prior to Visit  Medication Sig Dispense Refill  . ACCU-CHEK GUIDE test strip USE SEGUN LO INDICADO CUATRO VECES AL DIA SEGUN LO INDICADO 100 strip 5  . atorvastatin (LIPITOR) 40 MG tablet TOME UNA TABLETA TODOS LOS DIAS 90 tablet 1  . Blood Glucose Monitoring Suppl (ACCU-CHEK GUIDE ME) w/Device KIT USE SEGUN LO INDICADO CUATRO VECES AL DIA SEGUN LO  INDICADO 1 kit 0  . Calcium Carbonate Antacid (TUMS E-X PO) Take by mouth as needed.    . ciclopirox (PENLAC) 8 % solution Apply topically at bedtime. Apply over nail and surrounding skin. Apply daily over previous coat. After seven (7) days, may remove with alcohol and continue cycle. 6.6 mL 0  . fluticasone (FLONASE) 50 MCG/ACT nasal spray SPRAY 2 SPRAYS INTO EACH NOSTRIL TODOS LOS DIAS 16 g 1  . ibuprofen (ADVIL,MOTRIN) 200 MG tablet Take 200 mg by mouth every 6 (six) hours as needed.    Marland Kitchen levothyroxine (SYNTHROID) 50 MCG tablet TOME UNA TABLETA TODOS LOS DIAS 90 tablet 1  . metFORMIN (GLUCOPHAGE) 1000 MG tablet Take 1 tablet (1,000 mg total) by mouth 2 (two) times daily with a meal. 180 tablet 1  . omeprazole (PRILOSEC) 40 MG capsule TOME UNA CAPSULA TODOS LOS DIAS 90 capsule 2  . pantoprazole (PROTONIX) 40 MG tablet Take 1 tablet (40 mg total) by mouth daily. 30 tablet 3  . pioglitazone (ACTOS) 30 MG tablet Take 1 tablet (30 mg total) by mouth daily. 30 tablet 2   No current facility-administered medications on file prior to visit.    BP 136/79 (BP Location: Left Arm, Patient Position: Sitting, Cuff Size: Small)   Pulse 68   Temp (!) 97.4 F (36.3 C) (Temporal)   Resp 16   Ht 5' 3"  (1.6 m)   Wt 158 lb (71.7 kg)   LMP 12/30/2010   SpO2 100%   BMI 27.99 kg/m       Objective:   Physical Exam Constitutional:      Appearance: She is well-developed.  Neck:     Thyroid: No thyromegaly.  Cardiovascular:     Rate and Rhythm: Normal rate and regular rhythm.     Heart sounds: Normal heart sounds. No murmur.  Pulmonary:     Effort: Pulmonary effort is normal. No respiratory distress.     Breath sounds: Normal breath sounds. No wheezing.  Musculoskeletal:     Cervical back: Neck supple.     Comments: Some swelling and tenderness noted of right fore arm dorsally.  No erythema.  Skin:    General: Skin is warm and dry.  Neurological:     Mental Status: She is alert and oriented to  person, place, and time.  Psychiatric:        Behavior: Behavior  normal.        Thought Content: Thought content normal.        Judgment: Judgment normal.           Assessment & Plan:  Arm pain-this is likely due to repetitive nature of crocheting.  I advised the patient to DC crocheting.  Can use Voltaren twice daily as needed.  She is to call if symptoms worsen or fail to improve.  Diabetes type 2-clinically stable.  Continue current meds. Obtain A1C.  Hyperlipidemia-maintained on atorvastatin.  LDL is at goal. Lab Results  Component Value Date   CHOL 98 12/17/2019   HDL 39.90 12/17/2019   LDLCALC 45 12/17/2019   TRIG 65.0 12/17/2019   CHOLHDL 2 12/17/2019    GERD-this is stable on proton pump inhibitor.  Continue same.  Hypothyroid-stable on current dose of Synthroid.  Continue same. Lab Results  Component Value Date   TSH 1.11 12/17/2019

## 2020-04-13 ENCOUNTER — Telehealth: Payer: Self-pay | Admitting: Family

## 2020-04-13 NOTE — Telephone Encounter (Signed)
Opened in error

## 2020-04-17 ENCOUNTER — Other Ambulatory Visit (INDEPENDENT_AMBULATORY_CARE_PROVIDER_SITE_OTHER): Payer: BC Managed Care – PPO

## 2020-04-17 ENCOUNTER — Other Ambulatory Visit: Payer: Self-pay

## 2020-04-17 DIAGNOSIS — E785 Hyperlipidemia, unspecified: Secondary | ICD-10-CM | POA: Diagnosis not present

## 2020-04-17 DIAGNOSIS — E119 Type 2 diabetes mellitus without complications: Secondary | ICD-10-CM | POA: Diagnosis not present

## 2020-04-17 LAB — LIPID PANEL
Cholesterol: 148 mg/dL (ref 0–200)
HDL: 53.8 mg/dL (ref >39.00)
LDL Cholesterol: 77 mg/dL (ref 0–99)
NonHDL: 94.14
Total CHOL/HDL Ratio: 3
Triglycerides: 85 mg/dL (ref 0.0–149.0)
VLDL: 17 mg/dL (ref 0.0–40.0)

## 2020-04-17 LAB — HEMOGLOBIN A1C: Hgb A1c MFr Bld: 6.8 % — ABNORMAL HIGH (ref 4.6–6.5)

## 2020-04-17 LAB — TSH: TSH: 3.02 u[IU]/mL (ref 0.35–4.50)

## 2020-04-20 ENCOUNTER — Ambulatory Visit: Payer: BC Managed Care – PPO

## 2020-09-02 ENCOUNTER — Other Ambulatory Visit: Payer: Self-pay | Admitting: Family

## 2020-09-02 NOTE — Telephone Encounter (Signed)
Please contact pt to schedule follow up visit.

## 2020-09-07 NOTE — Telephone Encounter (Signed)
Was scheduled for 09-18-20

## 2020-09-18 ENCOUNTER — Encounter: Payer: Self-pay | Admitting: Family

## 2020-09-18 ENCOUNTER — Ambulatory Visit: Payer: BC Managed Care – PPO | Admitting: Family

## 2020-09-18 ENCOUNTER — Other Ambulatory Visit: Payer: Self-pay

## 2020-09-18 VITALS — BP 110/70 | HR 87 | Resp 16 | Ht 63.0 in | Wt 152.0 lb

## 2020-09-18 DIAGNOSIS — Z23 Encounter for immunization: Secondary | ICD-10-CM | POA: Diagnosis not present

## 2020-09-18 DIAGNOSIS — K219 Gastro-esophageal reflux disease without esophagitis: Secondary | ICD-10-CM

## 2020-09-18 DIAGNOSIS — E785 Hyperlipidemia, unspecified: Secondary | ICD-10-CM

## 2020-09-18 DIAGNOSIS — E119 Type 2 diabetes mellitus without complications: Secondary | ICD-10-CM

## 2020-09-18 DIAGNOSIS — K140 Glossitis: Secondary | ICD-10-CM

## 2020-09-18 DIAGNOSIS — E559 Vitamin D deficiency, unspecified: Secondary | ICD-10-CM

## 2020-09-18 DIAGNOSIS — E039 Hypothyroidism, unspecified: Secondary | ICD-10-CM

## 2020-09-18 NOTE — Progress Notes (Signed)
Subjective:    Patient ID: Bailey Harris, female    DOB: [birth year], 59 y.o.   MRN: [id number]  HPI  Patient is a 59 yr old female who presents today for follow up. She is accompanied today by her husband who assists with translation.   DM2- on metformin 1069m bid.  Lab Results  Component Value Date   HGBA1C 6.8 (H) 04/17/2020   HGBA1C 6.3 12/17/2019   HGBA1C 8.1 (H) 09/24/2019   Lab Results  Component Value Date   MICROALBUR 1.1 09/24/2019   LDLCALC 77 04/17/2020   CREATININE 0.57 12/17/2019   CREATININE 0.57 12/17/2019   GERD- maintained on PPI   Hypothyroid- maintained on synthroid.  Lab Results  Component Value Date   TSH 3.02 04/17/2020   Hyperlipidemia- maintained on lipitor.  Lab Results  Component Value Date   CHOL 148 04/17/2020   HDL 53.80 04/17/2020   LDLCALC 77 04/17/2020   TRIG 85.0 04/17/2020   CHOLHDL 3 04/17/2020   C/o buring on the tip of her tongue sometimes.   Review of Systems    see HPI  Past Medical History:  Diagnosis Date   Anemia    during pregnancy   Diabetes mellitus without complication ([facility])    Fatty liver    GERD (gastroesophageal reflux disease)    Heart murmur    HLD (hyperlipidemia)    Hypothyroidism    Osteopenia    Pancreatic insufficiency    insulin irregularity   Vitamin D deficiency      Social History   Socioeconomic History   Marital status: Married    Spouse name: Not on file   Number of children: 3   Years of education: Not on file   Highest education level: Not on file  Occupational History   Occupation: home maker  Tobacco Use   Smoking status: Never Smoker   Smokeless tobacco: Never Used  VScientific laboratory technicianUse: Never used  Substance and Sexual Activity   Alcohol use: No    Alcohol/week: 0.0 standard drinks   Drug use: No   Sexual activity: Not on file  Other Topics Concern   Not on file  Social History Narrative   Married   3 children daughter- 145  Son 157    Son 1993   [place]  Completed very little education, [place] little [place] assistant[place]rain:    Difficulty of Paying Living Expenses: Not on fComcastInsecurity:    Worried About RCharity fundraiserin the Last Year: Not on file   RYRC Worldwideof Food in the Last Year: Not on file  Transportation Needs:    LFilm/video editor(Medical): Not on file   Lack of Transportation (Non-Medical): Not on file  Physical Activity:    Days of Exercise per Week: Not on file   Minutes of Exercise per Session: Not on file  Stress:    Feeling of Stress : Not on file  Social Connections:    Frequency of Communication with Friends and Family: Not on file   Frequency of Social Gatherings with Friends and Family: Not on file   Attends Religious Services: Not on file   Active Member of Clubs or Organizations: Not on file   Attends CArchivistMeetings:  Not on file   Marital Status: Not on file  Intimate Partner Violence:    Fear of Current or Ex-Partner: Not on file   Emotionally Abused: Not on file   Physically Abused: Not on file   Sexually Abused: Not on file    Past Surgical History:  Procedure Laterality Date   CESAREAN SECTION     CHOLECYSTECTOMY N/A 03/31/2018   Procedure: LAPAROSCOPIC CHOLECYSTECTOMY WITH INTRAOPERATIVE CHOLANGIOGRAM;  Surgeon: Ralene Ok, MD;  Location: [facility];  Service: General;  Laterality: N/A;   TUBAL LIGATION      Family History  Problem Relation Age of Onset   Stomach cancer Mother        died at 32 from CA Mets   CVA Father        died age 71   CVA Sister        27- died   CVA Brother        46 died   Colon cancer Neg Hx    Esophageal cancer Neg Hx    Rectal cancer Neg Hx     No Known Allergies  Current Outpatient Medications on File Prior to Visit  Medication Sig  Dispense Refill   ACCU-CHEK GUIDE test strip USE SEGUN LO INDICADO CUATRO VECES AL DIA SEGUN LO INDICADO 100 strip 5   atorvastatin (LIPITOR) 40 MG tablet TOME UNA TABLETA TODOS LOS DIAS 90 tablet 1   Blood Glucose Monitoring Suppl (ACCU-CHEK GUIDE ME) w/Device KIT USE SEGUN LO INDICADO CUATRO VECES AL DIA SEGUN LO INDICADO 1 kit 0   Calcium Carbonate Antacid (TUMS E-X PO) Take by mouth as needed.     ciclopirox (PENLAC) 8 % solution Apply topically at bedtime. Apply over nail and surrounding skin. Apply daily over previous coat. After seven (7) days, may remove with alcohol and continue cycle. 6.6 mL 0   diclofenac (VOLTAREN) 75 MG EC tablet Take 1 tablet (75 mg total) by mouth 2 (two) times daily as needed. 30 tablet 0   fluticasone (FLONASE) 50 MCG/ACT nasal spray SPRAY 2 SPRAYS INTO EACH NOSTRIL TODOS LOS DIAS 16 g 1   ibuprofen (ADVIL,MOTRIN) 200 MG tablet Take 200 mg by mouth every 6 (six) hours as needed.     levothyroxine (SYNTHROID) 50 MCG tablet TOME UNA TABLETA TODOS LOS DIAS 90 tablet 1   metFORMIN (GLUCOPHAGE) 1000 MG tablet TOME UNA TABLETA DOS VECES AL DIA CON LAS COMIDAS 60 tablet 0   omeprazole (PRILOSEC) 40 MG capsule TOME UNA CAPSULA TODOS LOS DIAS 90 capsule 2   pantoprazole (PROTONIX) 40 MG tablet Take 1 tablet (40 mg total) by mouth daily. 30 tablet 3   pioglitazone (ACTOS) 30 MG tablet Take 1 tablet (30 mg total) by mouth daily. 30 tablet 2   No current facility-administered medications on file prior to visit.    BP 110/70 (BP Location: Right Arm, Patient Position: Sitting, Cuff Size: Normal)    Pulse 87    Resp 16    Ht _0  (1.6 m)    Wt 152 lb (68.9 kg)    LMP 12/30/2010    SpO2 97%    BMI 26.93 kg/m    Objective:   Physical Exam Constitutional:      Appearance: She is well-developed.  HENT:     Head:     Comments: Tongue is glossy Cardiovascular:     Rate and Rhythm: Normal rate and regular rhythm.     Heart sounds: Normal heart sounds. No  murmur  heard.   Pulmonary:     Effort: Pulmonary effort is normal. No respiratory distress.     Breath sounds: Normal breath sounds. No wheezing.  Psychiatric:        Behavior: Behavior normal.        Thought Content: Thought content normal.        Judgment: Judgment normal.           Assessment & Plan:  DM2- reports sugars have been stable.  Continue metformin. Obtain follow up A1C. Flu shot today.  Hyperlipidemia- tolerating statin, continue lipitor.  Obtain follow up lipid panel.  Vit D deficiency- obtain follow up vit D.  Glossitis- check b12/folate level.  GERD- stable without medication.   Hypothyroid- obtain follow up TSH.  This visit occurred during the SARS-CoV-2 public health emergency.  Safety protocols were in place, including screening questions prior to the visit, additional usage of staff PPE, and extensive cleaning of exam room while observing appropriate contact time as indicated for disinfecting solutions.

## 2020-09-18 NOTE — Patient Instructions (Signed)
Please complete lab work prior to leaving.   

## 2020-09-21 LAB — COMPREHENSIVE METABOLIC PANEL
AG Ratio: 1.6 (calc) (ref 1.0–2.5)
ALT: 59 U/L — ABNORMAL HIGH (ref 6–29)
AST: 36 U/L — ABNORMAL HIGH (ref 10–35)
Albumin: 4.4 g/dL (ref 3.6–5.1)
Alkaline phosphatase (APISO): 77 U/L (ref 37–153)
BUN: 14 mg/dL (ref 7–25)
CO2: 25 mmol/L (ref 20–32)
Calcium: 9.5 mg/dL (ref 8.6–10.4)
Chloride: 106 mmol/L (ref 98–110)
Creat: 0.63 mg/dL (ref 0.50–1.05)
Globulin: 2.7 g/dL (calc) (ref 1.9–3.7)
Glucose, Bld: 94 mg/dL (ref 65–99)
Potassium: 4.5 mmol/L (ref 3.5–5.3)
Sodium: 141 mmol/L (ref 135–146)
Total Bilirubin: 1.1 mg/dL (ref 0.2–1.2)
Total Protein: 7.1 g/dL (ref 6.1–8.1)

## 2020-09-21 LAB — TSH: TSH: 2.47 mIU/L (ref 0.40–4.50)

## 2020-09-21 LAB — HEMOGLOBIN A1C
Hgb A1c MFr Bld: 6.3 % of total Hgb — ABNORMAL HIGH (ref <5.7)
Mean Plasma Glucose: 134 (calc)
eAG (mmol/L): 7.4 (calc)

## 2020-09-21 LAB — VITAMIN D 1,25 DIHYDROXY
Vitamin D 1, 25 (OH)2 Total: 27 pg/mL (ref 18–72)
Vitamin D2 1, 25 (OH)2: <8 pg/mL
Vitamin D3 1, 25 (OH)2: 27 pg/mL

## 2020-09-21 LAB — B12 AND FOLATE PANEL
Folate: 12.2 ng/mL
Vitamin B-12: 613 pg/mL (ref 200–1100)

## 2020-10-08 ENCOUNTER — Other Ambulatory Visit: Payer: Self-pay | Admitting: Family

## 2020-10-30 ENCOUNTER — Other Ambulatory Visit (HOSPITAL_BASED_OUTPATIENT_CLINIC_OR_DEPARTMENT_OTHER): Payer: Self-pay | Admitting: Internal Medicine

## 2020-10-30 ENCOUNTER — Ambulatory Visit: Payer: BC Managed Care – PPO | Attending: Internal Medicine

## 2020-10-30 DIAGNOSIS — Z23 Encounter for immunization: Secondary | ICD-10-CM

## 2020-10-30 NOTE — Progress Notes (Signed)
° °  Covid-19 Vaccination Clinic  Name:  Bailey Harris    MRN: [id number] DOB: 08/02/1961  10/30/2020  [name] was observed post Covid-19 immunization for 15 minutes without incident. She was provided with Vaccine Information Sheet and instruction to access the V-Safe system.   [name] was instructed to call 911 with any severe reactions post vaccine:  Difficulty breathing   Swelling of face and throat   A fast heartbeat   A bad rash all over body   Dizziness and weakness

## 2020-11-08 MED FILL — PFIZER-BIONTECH COVID-19 VA: 30 | 1 days supply | Qty: 0 | Fill #0

## 2020-12-09 ENCOUNTER — Other Ambulatory Visit: Payer: Self-pay | Admitting: Family

## 2020-12-25 ENCOUNTER — Encounter: Payer: BC Managed Care – PPO | Admitting: Family

## 2021-01-01 ENCOUNTER — Encounter: Payer: BC Managed Care – PPO | Admitting: Family

## 2021-01-03 ENCOUNTER — Telehealth: Payer: Self-pay | Admitting: Family

## 2021-01-03 NOTE — Telephone Encounter (Signed)
Patient has tested positive for covid 19, please advise next step to take

## 2021-01-03 NOTE — Telephone Encounter (Signed)
Needs virtual visit 

## 2021-01-04 ENCOUNTER — Other Ambulatory Visit: Payer: BC Managed Care – PPO

## 2021-01-05 ENCOUNTER — Other Ambulatory Visit: Payer: Self-pay

## 2021-01-05 ENCOUNTER — Telehealth (INDEPENDENT_AMBULATORY_CARE_PROVIDER_SITE_OTHER): Payer: BC Managed Care – PPO | Admitting: Family

## 2021-01-05 DIAGNOSIS — U071 COVID-19: Secondary | ICD-10-CM

## 2021-01-05 DIAGNOSIS — M545 Low back pain, unspecified: Secondary | ICD-10-CM | POA: Diagnosis not present

## 2021-01-05 MED ORDER — BENZONATATE 100 MG PO CAPS: 100.0000 mg | ORAL_CAPSULE | Freq: Three times a day (TID) | ORAL | 0 refills | Status: DC | PRN

## 2021-01-05 NOTE — Progress Notes (Signed)
Virtual Visit via Video Note  I connected with Bailey Harris on 01/05/21 at  9:20 AM EST  and verified that I am speaking with the correct person using two identifiers.  We initially attempted to connect via video, however she had technical issues with her iPhone and was unable to connect via video.  As a result the visit was transition to a telephone visit.  Location: Patient: home Provider: work   I discussed the limitations of evaluation and management by telemedicine and the a]vailability of in person appointments. The patient expressed understanding and agreed to proceed. Only the patient and myself were present for today's video call.   History of Present Illness:  Patient is a 60 year old female who presents today to discuss COVID-19 infection.  Her husband assists with translation for today's visit.  The patient tested + for covid-19 onTuesday evening (1/4).  Symptoms began on 1/3 with cough, subjective fever (no thermometer), diarrhea, myalgia, headache.  She denies loss of taste. Does have loss of smell. She denies associated SOB.  She has had pfizer vaccines x 3.  Appetite is poor but she is staying hydrated per husband and eating.   She reports left sided back pain low.  Pain is "all the time." Denies dysuria.  Denies hematuria.    Observations/Objective:   Gen: Awake, alert Resp: Breathing sounds even and non-labored- coarse cough noted Psych: calm/pleasant demeanor Neuro: Alert and Oriented x 3, speech is clear.   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  These treatments are extremely scarce for right now, however so I am not sure that they will be available to her.  I advised her husband to make sure that she continues aggressive hydration, especially in the setting of diarrhea.  Will prescribe Tessalon as needed for cough.  If  she develops increased weakness, or if she develops shortness of breath, she is advised to go to the emergency department.  Left-sided low back pain-suspect musculoskeletal low back pain.  Recommended Tylenol as needed.  She does not have any urinary symptoms at this time to suggest pyelonephritis, however I have advised the husband to let us know if she develops dysuria or hematuria.  He verbalizes understanding.  15 minutes were spent on today's phone visit.  Follow Up Instructions:    I discussed the assessment and treatment plan with the patient. The patient was provided an opportunity to ask questions and all were answered. The patient agreed with the plan and demonstrated an understanding of the instructions.   The patient was advised to call back or seek an in-person evaluation if the symptoms worsen or if the condition fails to improve as anticipated.  Nance Pear, NP

## 2021-02-05 ENCOUNTER — Other Ambulatory Visit: Payer: Self-pay

## 2021-02-05 ENCOUNTER — Telehealth: Payer: Self-pay | Admitting: Family

## 2021-02-05 ENCOUNTER — Encounter: Payer: Self-pay | Admitting: Family

## 2021-02-05 ENCOUNTER — Ambulatory Visit (HOSPITAL_BASED_OUTPATIENT_CLINIC_OR_DEPARTMENT_OTHER)
Admission: RE | Admit: 2021-02-05 | Discharge: 2021-02-05 | Disposition: A | Discharge status: Home / Self Care | Payer: BC Managed Care – PPO | Source: Ambulatory Visit | Attending: Family | Admitting: Family

## 2021-02-05 ENCOUNTER — Ambulatory Visit (INDEPENDENT_AMBULATORY_CARE_PROVIDER_SITE_OTHER): Payer: BC Managed Care – PPO | Admitting: Family

## 2021-02-05 VITALS — BP 139/83 | HR 67 | Temp 98.8°F | Resp 16 | Ht 63.0 in | Wt 160.6 lb

## 2021-02-05 DIAGNOSIS — E119 Type 2 diabetes mellitus without complications: Secondary | ICD-10-CM | POA: Diagnosis not present

## 2021-02-05 DIAGNOSIS — Z0000 Encounter for general adult medical examination without abnormal findings: Secondary | ICD-10-CM

## 2021-02-05 DIAGNOSIS — K649 Unspecified hemorrhoids: Secondary | ICD-10-CM

## 2021-02-05 DIAGNOSIS — R809 Proteinuria, unspecified: Secondary | ICD-10-CM

## 2021-02-05 DIAGNOSIS — Z1231 Encounter for screening mammogram for malignant neoplasm of breast: Secondary | ICD-10-CM | POA: Insufficient documentation

## 2021-02-05 DIAGNOSIS — Z23 Encounter for immunization: Secondary | ICD-10-CM | POA: Diagnosis not present

## 2021-02-05 DIAGNOSIS — E785 Hyperlipidemia, unspecified: Secondary | ICD-10-CM | POA: Diagnosis not present

## 2021-02-05 DIAGNOSIS — M2550 Pain in unspecified joint: Secondary | ICD-10-CM

## 2021-02-05 LAB — SEDIMENTATION RATE: Sed Rate: 7 mm/hr (ref 0–30)

## 2021-02-05 LAB — COMPREHENSIVE METABOLIC PANEL
ALT: 17 U/L (ref 0–35)
AST: 16 U/L (ref 0–37)
Albumin: 4.3 g/dL (ref 3.5–5.2)
Alkaline Phosphatase: 71 U/L (ref 39–117)
BUN: 11 mg/dL (ref 6–23)
CO2: 27 mEq/L (ref 19–32)
Calcium: 9.1 mg/dL (ref 8.4–10.5)
Chloride: 108 mEq/L (ref 96–112)
Creatinine, Ser: 0.58 mg/dL (ref 0.40–1.20)
GFR: 99.76 mL/min (ref >60.00)
Glucose, Bld: 118 mg/dL — ABNORMAL HIGH (ref 70–99)
Potassium: 4.1 mEq/L (ref 3.5–5.1)
Sodium: 142 mEq/L (ref 135–145)
Total Bilirubin: 0.9 mg/dL (ref 0.2–1.2)
Total Protein: 7 g/dL (ref 6.0–8.3)

## 2021-02-05 LAB — MICROALBUMIN / CREATININE URINE RATIO
Creatinine,U: 184.6 mg/dL
Microalb Creat Ratio: 1.3 mg/g (ref 0.0–30.0)
Microalb, Ur: 2.5 mg/dL — ABNORMAL HIGH (ref 0.0–1.9)

## 2021-02-05 LAB — LIPID PANEL
Cholesterol: 185 mg/dL (ref 0–200)
HDL: 43.8 mg/dL (ref >39.00)
LDL Cholesterol: 119 mg/dL — ABNORMAL HIGH (ref 0–99)
NonHDL: 141.26
Total CHOL/HDL Ratio: 4
Triglycerides: 112 mg/dL (ref 0.0–149.0)
VLDL: 22.4 mg/dL (ref 0.0–40.0)

## 2021-02-05 LAB — HEMOGLOBIN A1C: Hgb A1c MFr Bld: 6.4 % (ref 4.6–6.5)

## 2021-02-05 MED ORDER — LISINOPRIL 5 MG PO TABS: 5.0000 mg | ORAL_TABLET | Freq: Every day | ORAL | 1 refills | Status: DC

## 2021-02-05 NOTE — Telephone Encounter (Signed)
Sugar is at goal. There is some protein in her urine. I would like her to add lisinopril 5mg  once daily. This will help protect her kidneys. Repeat bmet in 1 week, dx microalbuminuria.

## 2021-02-05 NOTE — Progress Notes (Signed)
Subjective:    Patient ID: Bailey Harris, female    DOB: 11/13/1961, 60 y.o.   MRN: [id number]  HPI  Patient presents today for complete physical.  Immunizations: tdap due, completed shingrix and pfizer series.   Diet: fair diet Exercise:  Not exercising Colonoscopy: due 2024 Dexa: 2020 Pap Smear: due November 2022 Mammogram:12/20 Vision: 2 years ago Dental:  due Wt Readings from Last 3 Encounters:  02/05/21 160 lb 9.6 oz (72.8 kg)  09/18/20 152 lb (68.9 kg)  04/11/20 158 lb (71.7 kg)    Review of Systems  Constitutional: Positive for unexpected weight change.  HENT: Negative for hearing loss and rhinorrhea.   Eyes: Negative for visual disturbance.  Respiratory: Negative for cough and shortness of breath.   Cardiovascular: Negative for chest pain.  Gastrointestinal: Positive for rectal pain. Negative for anal bleeding, constipation and diarrhea.  Genitourinary: Negative for dysuria and frequency.  Musculoskeletal: Positive for arthralgias ("a lot"). Negative for myalgias.  Skin: Negative for rash.  Neurological: Positive for headaches (mild occasional).  Hematological: Negative for adenopathy.  Psychiatric/Behavioral:       Denies depression/anxiety   Past Medical History:  Diagnosis Date  . Anemia    during pregnancy  . Diabetes mellitus without complication ([place])   . Fatty liver   . GERD (gastroesophageal reflux disease)   . Heart murmur   . HLD (hyperlipidemia)   . Hypothyroidism   . Osteopenia   . Pancreatic insufficiency    insulin irregularity  . Vitamin D deficiency      Social History   Socioeconomic History  . Marital status: Married    Spouse name: Not on file  . Number of children: 3  . Years of education: Not on file  . Highest education level: Not on file  Occupational History  . Occupation: home maker  Tobacco Use  . Smoking status: Never Smoker  . Smokeless tobacco: Never Used  Vaping Use  . Vaping Use: Never used  Substance and  Sexual Activity  . Alcohol use: No    Alcohol/week: 0.0 standard drinks  . Drug use: No  . Sexual activity: Not on file  Other Topics Concern  . 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 CESAREAN SECTION    . CHOLECYSTECTOMY N/A 03/31/2018   Procedure: LAPAROSCOPIC CHOLECYSTECTOMY WITH INTRAOPERATIVE CHOLANGIOGRAM;  Surgeon: Ralene Ok, MD;  Location: [place];  Service: General;  Laterality: N/A;  . TUBAL LIGATION      Family History  Problem Relation Age of Onset  . Stomach cancer Mother        died at 59 from CA Mets  . CVA Father        died age 61  . CVA Sister        14- died  . CVA Brother  50 died  . Colon cancer Neg Hx   . Esophageal cancer Neg Hx   . Rectal cancer Neg Hx     No Known Allergies  Current Outpatient Medications on File Prior to Visit  Medication Sig Dispense Refill  . ACCU-CHEK GUIDE test strip USE SEGUN LO INDICADO CUATRO VECES AL DIA SEGUN LO INDICADO 100 strip 5  . Blood Glucose Monitoring Suppl (ACCU-CHEK GUIDE ME) w/Device KIT USE SEGUN LO INDICADO CUATRO VECES AL DIA SEGUN LO INDICADO 1 kit 0  . levothyroxine (SYNTHROID) 50 MCG tablet TOME UNA TABLETA TODOS LOS DIAS 90 tablet 1  . metFORMIN (GLUCOPHAGE) 1000 MG tablet TOME UNA TABLETA DOS VECES AL DIA CON LAS COMIDAS 60 tablet 5  . atorvastatin (LIPITOR) 40 MG tablet TOME UNA TABLETA TODOS LOS DIAS (Patient not taking: Reported on 02/05/2021) 90  tablet 1   No current facility-administered medications on file prior to visit.    BP 139/83 (BP Location: Right Arm, Patient Position: Sitting, Cuff Size: Small)   Pulse 67   Temp 98.8 F (37.1 C) (Oral)   Resp 16   Ht 5' 3"  (1.6 m)   Wt 160 lb 9.6 oz (72.8 kg)   LMP 12/30/2010   SpO2 100%   BMI 28.45 kg/m       Objective:   Physical Exam   Physical Exam  Constitutional: She is oriented to person, place, and time. She appears well-developed and well-nourished. No distress.  HENT:  Head: Normocephalic and atraumatic.  Right Ear: Tympanic membrane and ear canal normal.  Left Ear: Tympanic membrane and ear canal normal.  Mouth/Throat: Not examined- pt wearing mask Eyes: Pupils are equal, round, and reactive to light. No scleral icterus.  Neck: Normal range of motion. No thyromegaly present.  Cardiovascular: Normal rate and regular rhythm.   No murmur heard. Pulmonary/Chest: Effort normal and breath sounds normal. No respiratory distress. He has no wheezes. She has no rales. She exhibits no tenderness.  Abdominal: Soft. Bowel sounds are normal. She exhibits no distension and no mass. There is no tenderness. There is no rebound and no guarding.  Musculoskeletal: She exhibits no edema.  Lymphadenopathy:    She has no cervical adenopathy.  Neurological: She is alert and oriented to person, place, and time. She has normal patellar reflexes. She exhibits normal muscle tone. Coordination normal.  Skin: Skin is warm and dry.  Psychiatric: She has a normal mood and affect. Her behavior is normal. Judgment and thought content normal.  Breasts: Examined lying Anus and perineum: some small external hemorrhoids. Tender internal hemorrhoid palpated       Assessment & Plan:  Preventative care- Td today.  Mammogram due- order placed.  Colo up to date.   Discussed diet/exercise/weight loss.  arthralgia- uncontrolled. will obtain ANA, ESR, Rheumatoid factor for further evaluation.    DM2- clinically stable on metformin 554m bid.  Obtain follow up a1c.   Hemorrhoids- recommended preparation- H, keeping stool soft with water/high fiber diet and prn stool softeners to avoid straining.   This visit occurred during the SARS-CoV-2 public health emergency.  Safety protocols were in place, including screening questions prior to the visit, additional usage of staff PPE, and extensive cleaning of exam room while observing appropriate contact time as indicated for disinfecting solutions.

## 2021-02-05 NOTE — Patient Instructions (Addendum)
Please schedule a routine eye appointment and dental appointment. Please schedule mammogram on the first floor. Complete lab work prior to leaving.

## 2021-02-05 NOTE — Telephone Encounter (Signed)
Patient advised of results, new prescription and provider's advise. She was scheduled to come in for labs 02-12-21

## 2021-02-07 ENCOUNTER — Telehealth: Payer: Self-pay | Admitting: Family

## 2021-02-07 DIAGNOSIS — R768 Other specified abnormal immunological findings in serum: Secondary | ICD-10-CM

## 2021-02-07 LAB — ANTI-NUCLEAR AB-TITER (ANA TITER): ANA Titer 1: 1:640 {titer} — ABNORMAL HIGH

## 2021-02-07 LAB — RHEUMATOID FACTOR: Rheumatoid fact SerPl-aCnc: <14 IU/mL (ref <14)

## 2021-02-07 LAB — ANA: Anti Nuclear Antibody (ANA): POSITIVE — AB

## 2021-02-07 NOTE — Telephone Encounter (Signed)
Advised pt's husband re: + ANA and plan for referral to rheumatology. He will relay message to his wife.

## 2021-02-12 ENCOUNTER — Other Ambulatory Visit: Payer: Self-pay

## 2021-02-12 ENCOUNTER — Other Ambulatory Visit (INDEPENDENT_AMBULATORY_CARE_PROVIDER_SITE_OTHER): Payer: BC Managed Care – PPO

## 2021-02-12 DIAGNOSIS — R809 Proteinuria, unspecified: Secondary | ICD-10-CM | POA: Diagnosis not present

## 2021-02-13 LAB — BASIC METABOLIC PANEL
BUN: 17 mg/dL (ref 6–23)
CO2: 26 mEq/L (ref 19–32)
Calcium: 9.5 mg/dL (ref 8.4–10.5)
Chloride: 103 mEq/L (ref 96–112)
Creatinine, Ser: 0.63 mg/dL (ref 0.40–1.20)
GFR: 97.78 mL/min (ref >60.00)
Glucose, Bld: 77 mg/dL (ref 70–99)
Potassium: 4.2 mEq/L (ref 3.5–5.1)
Sodium: 137 mEq/L (ref 135–145)

## 2021-03-26 ENCOUNTER — Telehealth: Payer: Self-pay | Admitting: Family

## 2021-03-26 NOTE — Telephone Encounter (Signed)
Patient recd a bill from [employer] for [facility] 09/18/20.Marland Kitchen Per the insurance the vitamin  D Code is not correct. They states that it should be  [id number].Marland Kitchen

## 2021-04-02 ENCOUNTER — Encounter: Payer: Self-pay | Admitting: Family

## 2021-04-02 ENCOUNTER — Other Ambulatory Visit: Payer: Self-pay

## 2021-04-02 ENCOUNTER — Ambulatory Visit (INDEPENDENT_AMBULATORY_CARE_PROVIDER_SITE_OTHER): Payer: BC Managed Care – PPO | Admitting: Family

## 2021-04-02 VITALS — BP 126/67 | HR 71 | Temp 98.6°F | Resp 16 | Wt 161.0 lb

## 2021-04-02 DIAGNOSIS — E785 Hyperlipidemia, unspecified: Secondary | ICD-10-CM

## 2021-04-02 DIAGNOSIS — R109 Unspecified abdominal pain: Secondary | ICD-10-CM

## 2021-04-02 DIAGNOSIS — E118 Type 2 diabetes mellitus with unspecified complications: Secondary | ICD-10-CM | POA: Diagnosis not present

## 2021-04-02 DIAGNOSIS — R197 Diarrhea, unspecified: Secondary | ICD-10-CM | POA: Diagnosis not present

## 2021-04-02 LAB — BASIC METABOLIC PANEL
BUN: 19 mg/dL (ref 6–23)
CO2: 26 mEq/L (ref 19–32)
Calcium: 9.5 mg/dL (ref 8.4–10.5)
Chloride: 105 mEq/L (ref 96–112)
Creatinine, Ser: 0.62 mg/dL (ref 0.40–1.20)
GFR: 98.06 mL/min (ref >60.00)
Glucose, Bld: 168 mg/dL — ABNORMAL HIGH (ref 70–99)
Potassium: 4 mEq/L (ref 3.5–5.1)
Sodium: 139 mEq/L (ref 135–145)

## 2021-04-02 MED ORDER — ATORVASTATIN CALCIUM 20 MG PO TABS: 20.0000 mg | ORAL_TABLET | Freq: Every day | ORAL | 3 refills | Status: DC

## 2021-04-02 NOTE — Progress Notes (Signed)
Subjective:    Patient ID: Bailey Harris, female    DOB: 10-20-1961, 60 y.o.   MRN: [id number]  HPI  Patient is a 60 yr old female who presents today with some GI concerns. Her husband accompanies her here today and helps with translating. She states that she has had intermittent diarrhea on and off for 4 months. She also reports some left side abdominal pain. Denies any blood. "only hurts when she goes to the bathroom." She reports that she sometimes has diarrhea 8-10 days.  She denies any recent travel. This began after covid-19 infection.    Review of Systems    see HPI  Past Medical History:  Diagnosis Date  . Anemia    during pregnancy  . Diabetes mellitus type 2 with complications ([place]) 7/67/3419  . Diabetes mellitus without complication ([place])   . Fatty liver   . GERD (gastroesophageal reflux disease)   . Heart murmur   . HLD (hyperlipidemia)   . Hypothyroidism   . Osteopenia   . Pancreatic insufficiency    insulin irregularity  . Vitamin D deficiency      Social History   Socioeconomic History  . Marital status: Married    Spouse name: Not on file  . Number of children: 3  . Years of education: Not on file  . Highest education level: Not on file  Occupational History  . Occupation: home maker  Tobacco Use  . Smoking status: Never Smoker  . Smokeless tobacco: Never Used  Vaping Use  . Vaping Use: Never used  Substance and Sexual Activity  . Alcohol use: No    Alcohol/week: 0.0 standard drinks  . Drug use: No  . Sexual activity: Not on file  Other Topics Concern  . 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 CESAREAN SECTION    . CHOLECYSTECTOMY N/A 03/31/2018   Procedure: LAPAROSCOPIC CHOLECYSTECTOMY WITH INTRAOPERATIVE CHOLANGIOGRAM;  Surgeon: Ralene Ok, MD;  Location: [place];  Service: General;  Laterality: N/A;  . TUBAL LIGATION      Family History  Problem Relation Age of Onset  . Stomach cancer Mother        died at 23 from CA Mets  . CVA Father        died age 49  . CVA Sister        7- died  . CVA Brother        15 died  . Colon cancer Neg Hx   . Esophageal cancer Neg Hx   . Rectal cancer Neg Hx     No Known Allergies  Current Outpatient Medications on File Prior to Visit  Medication Sig Dispense Refill  . ACCU-CHEK GUIDE test strip USE SEGUN LO INDICADO CUATRO VECES AL DIA SEGUN LO INDICADO 100 strip 5  . atorvastatin (LIPITOR) 40 MG  tablet TOME UNA TABLETA TODOS LOS DIAS 90 tablet 1  . Blood Glucose Monitoring Suppl (ACCU-CHEK GUIDE ME) w/Device KIT USE SEGUN LO INDICADO CUATRO VECES AL DIA SEGUN LO INDICADO 1 kit 0  . COVID-19 mRNA vaccine, Pfizer, 30 MCG/0.3ML injection INJECT AS DIRECTED .3 mL 0  . levothyroxine (SYNTHROID) 50 MCG tablet TOME UNA TABLETA TODOS LOS DIAS 90 tablet 1  . lisinopril (ZESTRIL) 5 MG tablet Take 1 tablet (5 mg total) by mouth daily. 90 tablet 1  . metFORMIN (GLUCOPHAGE) 1000 MG tablet TOME UNA TABLETA DOS VECES AL DIA CON LAS COMIDAS 60 tablet 5   No current facility-administered medications on file prior to visit.    BP 126/67 (BP Location: Right Arm, Patient Position: Sitting, Cuff Size: Small)   Pulse 71   Temp 98.6 F (37 C) (Oral)   Resp 16   Wt 161 lb (73 kg)   LMP 12/30/2010   SpO2 100%   BMI 28.52 kg/m    Objective:   Physical Exam Constitutional:      Appearance: She is well-developed.  Cardiovascular:     Rate and  Rhythm: Normal rate and regular rhythm.     Heart sounds: Normal heart sounds. No murmur heard.   Pulmonary:     Effort: Pulmonary effort is normal. No respiratory distress.     Breath sounds: Normal breath sounds. No wheezing.  Abdominal:     General: Bowel sounds are normal.     Palpations: Abdomen is soft.     Tenderness: There is abdominal tenderness in the left upper quadrant and left lower quadrant.  Psychiatric:        Behavior: Behavior normal.        Thought Content: Thought content normal.        Judgment: Judgment normal.           Assessment & Plan:   Abdominal pain/diarrhea- will obtain Stool panel, CT abd/pelvis and refer to GI.  Will need to check bmet prior to scheduling CT scan.  Hyperlipidemia- states she has not been on atorvastatin in 6 months. LDL 119. Resume atorvastatin at 71m once daily.   This visit occurred during the SARS-CoV-2 public health emergency.  Safety protocols were in place, including screening questions prior to the visit, additional usage of staff PPE, and extensive cleaning of exam room while observing appropriate contact time as indicated for disinfecting solutions.

## 2021-04-02 NOTE — Patient Instructions (Signed)
Please complete lab work prior to leaving. Please complete stool study and return at your earliest convenience. You should be contacted about scheduling your appointment with GI.  Start atorvastatin 20mg .

## 2021-04-05 NOTE — Telephone Encounter (Signed)
I have spoken with PCP and we have no new dx codes to submit.  I have spoken with Jasmine at [facility], she states they submitted claim with codes: E11.9, K14.0, E03.9 and E55.9.  She states non covered tests were folic acid and Vit D.  I spoke with Linus Orn at [facility], she states E55.9 (vitamin D deficiency) is not a covered dx code for Vitamin D on her plan. K14.0 is a covered dx for folic acid but they do not see that code was submitted with the claim. Called [facility] and spoke with Truman Hayward and asked her to resend the claim for folic acid with Z61.0 code only. She will refile claim. Advised her that we have no additional codes for the Vit D.  Notified pt's spouse that insurance is still denying the vitamin d and he will be responsible for the $319.46 for the vitamin D. He voices understanding and frustration wanting to know how to prevent this from happening in the future.  I advised him to get test codes and dx codes of any labs being ordered and calling insurance to verify coverage before having future labs drawn to prevent surprise charges.  Pt's spouse voices understanding.

## 2021-04-09 ENCOUNTER — Other Ambulatory Visit: Payer: Self-pay

## 2021-04-09 ENCOUNTER — Ambulatory Visit (HOSPITAL_BASED_OUTPATIENT_CLINIC_OR_DEPARTMENT_OTHER)
Admission: RE | Admit: 2021-04-09 | Discharge: 2021-04-09 | Disposition: A | Discharge status: Home / Self Care | Payer: BC Managed Care – PPO | Source: Ambulatory Visit | Attending: Family | Admitting: Family

## 2021-04-09 ENCOUNTER — Encounter (HOSPITAL_BASED_OUTPATIENT_CLINIC_OR_DEPARTMENT_OTHER): Payer: Self-pay

## 2021-04-09 DIAGNOSIS — R109 Unspecified abdominal pain: Secondary | ICD-10-CM | POA: Insufficient documentation

## 2021-04-09 MED ORDER — IOHEXOL 300 MG/ML  SOLN
100.0000 mL | Freq: Once | INTRAMUSCULAR | Status: AC | PRN
  Administered 2021-04-09: 100 mL via INTRAVENOUS

## 2021-04-11 ENCOUNTER — Telehealth: Payer: Self-pay | Admitting: Family

## 2021-04-11 NOTE — Telephone Encounter (Signed)
Caller Bailey Harris,Bailey Harris  Caller # [phone]   Pt, husband call he would like to know the out come to the test and what they need to do please return call  Please advice

## 2021-04-16 ENCOUNTER — Telehealth: Payer: Self-pay | Admitting: Family

## 2021-04-16 MED ORDER — ATORVASTATIN CALCIUM 20 MG PO TABS: 20.0000 mg | ORAL_TABLET | Freq: Every day | ORAL | 3 refills | Status: AC

## 2021-04-16 NOTE — Telephone Encounter (Signed)
I saw message that pt is having bloody diarrhea and abdominal pain.  I see she has an appointment with GI in 10 days.  I would definitely like for her to see them, but in the meantime, if she continues to have bloody diarrhea she needs to go to the ER please.

## 2021-04-16 NOTE — Telephone Encounter (Signed)
Patient reports she has not seen any more blood in the stool. She is still having diarrhea and left abdominal pain. She will call GI to see if they can see her sooner.

## 2021-04-19 ENCOUNTER — Other Ambulatory Visit: Payer: Self-pay | Admitting: Family

## 2021-04-26 ENCOUNTER — Encounter: Payer: Self-pay | Admitting: Nurse Practitioner

## 2021-04-26 ENCOUNTER — Other Ambulatory Visit (INDEPENDENT_AMBULATORY_CARE_PROVIDER_SITE_OTHER): Payer: BC Managed Care – PPO

## 2021-04-26 ENCOUNTER — Ambulatory Visit: Payer: BC Managed Care – PPO | Admitting: Nurse Practitioner

## 2021-04-26 VITALS — BP 110/70 | HR 76 | Ht 62.25 in | Wt 161.1 lb

## 2021-04-26 DIAGNOSIS — R197 Diarrhea, unspecified: Secondary | ICD-10-CM

## 2021-04-26 DIAGNOSIS — K6289 Other specified diseases of anus and rectum: Secondary | ICD-10-CM | POA: Diagnosis not present

## 2021-04-26 DIAGNOSIS — R1032 Left lower quadrant pain: Secondary | ICD-10-CM

## 2021-04-26 LAB — CBC WITH DIFFERENTIAL/PLATELET
Basophils Absolute: 0.1 10*3/uL (ref 0.0–0.1)
Basophils Relative: 1.3 % (ref 0.0–3.0)
Eosinophils Absolute: 0.1 10*3/uL (ref 0.0–0.7)
Eosinophils Relative: 1.7 % (ref 0.0–5.0)
HCT: 38 % (ref 36.0–46.0)
Hemoglobin: 13.1 g/dL (ref 12.0–15.0)
Lymphocytes Relative: 31.5 % (ref 12.0–46.0)
Lymphs Abs: 1.7 10*3/uL (ref 0.7–4.0)
MCHC: 34.5 g/dL (ref 30.0–36.0)
MCV: 90.8 fl (ref 78.0–100.0)
Monocytes Absolute: 0.5 10*3/uL (ref 0.1–1.0)
Monocytes Relative: 9.7 % (ref 3.0–12.0)
Neutro Abs: 2.9 10*3/uL (ref 1.4–7.7)
Neutrophils Relative %: 55.8 % (ref 43.0–77.0)
Platelets: 258 10*3/uL (ref 150.0–400.0)
RBC: 4.18 Mil/uL (ref 3.87–5.11)
RDW: 12.6 % (ref 11.5–15.5)
WBC: 5.2 10*3/uL (ref 4.0–10.5)

## 2021-04-26 LAB — SEDIMENTATION RATE: Sed Rate: 20 mm/hr (ref 0–30)

## 2021-04-26 LAB — HIGH SENSITIVITY CRP: CRP, High Sensitivity: 1.08 mg/L (ref 0.000–5.000)

## 2021-04-26 NOTE — Patient Instructions (Signed)
If you are age 59 or older, your body mass index should be between 23-30. Your Body mass index is 29.23 kg/m. If this is out of the aforementioned range listed, please consider follow up with your Primary Care Provider.  If you are age 55 or younger, your body mass index should be between 19-25. Your Body mass index is 29.23 kg/m. If this is out of the aformentioned range listed, please consider follow up with your Primary Care Provider.   Your provider has requested that you go to the basement level for lab work before leaving today. Press "B" on the elevator. The lab is located at the first door on the left as you exit the elevator.

## 2021-04-26 NOTE — Progress Notes (Signed)
ASSESSMENT AND PLAN    # 60 yo female with 3 month history of LLQ burning pain and diarrhea with occasional blood. Rule out infectious etiology. Unremarkable CT scan w/ contrast on 04/09/21 for these symptoms.    --Check stool studies. --Check CBC,  ESR, CRP --Contact  Mr. Ryles with results at [phone].   # DMs. She says she takes medication for diabetes but nothing for diabetes is on her home med list? Will call pharmacy to confirm and update our list.   # Hypothyroidism. TSH normal in Sept 2021.   HISTORY OF PRESENT ILLNESS    Chief Complaint : left sided abdominal pain and diarrhea   Bailey Harris is a 60 y.o. female known to Dr. Henrene Pastor with a past medical history significant for diverticulosis, protalgia fujax, adenomatous colon polyps, chronic epigastric pain, hyperlipidemia, diabetes, fatty liver, GERD, COVID19,  and cholecystectomy.  See PMH below for any additional medical problems.    *Patient non-English speaking, husband serves as Market researcher.   Bailey Harris was last seen by Dr. Henrene Pastor in 2019 for evaluation of chronic epigastric pain. She previously declined EGD so we arranged for an upper GI series which was negative .Patient also has a history of small colon adenoma in 2019, a 5-year surveillance colonoscopy was recommended.   Patient comes in today accompanied by her husband.  Over the last 3 months she has been having burning LLQ pain and multiple large volume loose bowel movements a day.  No nocturnal stooling.  She has on occasion seen blood in her stool but not in the last couple of weeks.  The burning LLQ pain isn't relieved with defecation. She hasn't recently changed any of her medications.  No antibiotics in the last few months. No dietary changes.  No recent international travel. She also complains of constant rectal pain.  She has a history of proctalgia fugax.  Her weight is overall stable, down just a few pounds.  Patient saw PCP 04/02/2021 with above  complaints which she say started after COVID19 infection.  Stool studies were to be ordered but I do not see the order or any results in the Epic.  She was sent for a CT scan of the abdomen and pelvis w/ contrast which was negative for any acute abnormalities.  Normal-appearing stomach and bowel.  Nonobstructing subcentimeter right nephrolithiasis.    Patient offers that years ago in [place] she was diagnosed with a bacterial infection after presenting with diarrhea. Symptoms resolved with antitbiotics.    DATA REVIEWED: Hgb A1c 6.02 Feb 2021.     PREVIOUS ENDOSCOPIC EVALUATIONS / PERTINENT STUDIES:    May 2019 polyp surveillance colonoscopy -complete exam, excellent prep  One 3 mm polyp in the ascending colon, removed with a cold snare. Resected and retrieved. - Diverticulosis in the right colon. - The examination was otherwise normal on direct and retroflexion views.  04/12/21 CT scan  abd/ pelvis with contrast for abdominal pain and diarrhea  No acute intra-abdominal or pelvic finding by CT. Remote cholecystectomy Stable nonobstructing subcentimeter right nephrolithiasis Aortic Atherosclerosis (ICD10-I70.0).    Past Medical History:  Diagnosis Date  . Anemia    during pregnancy  . Diabetes mellitus type 2 with complications ([place]) 01/21/4824  . Diabetes mellitus without complication ([place])   . Fatty liver   . GERD (gastroesophageal reflux disease)   . Heart murmur   . HLD (hyperlipidemia)   . Hypothyroidism   . Osteopenia   . Pancreatic insufficiency    insulin  irregularity  . Vitamin D deficiency     Current Medications, Allergies, Past Surgical History, Family History and Social History were reviewed in [employer] record.   Current Outpatient Medications  Medication Sig Dispense Refill  . ACCU-CHEK GUIDE test strip USE SEGUN LO INDICADO CUATRO VECES AL DIA SEGUN LO INDICADO 100 strip 5  . atorvastatin (LIPITOR) 20 MG tablet Take 1 tablet (20 mg  total) by mouth daily. 90 tablet 3  . Blood Glucose Monitoring Suppl (ACCU-CHEK GUIDE ME) w/Device KIT USE SEGUN LO INDICADO CUATRO VECES AL DIA SEGUN LO INDICADO 1 kit 0  . levothyroxine (SYNTHROID) 50 MCG tablet Take 50 mcg by mouth daily before breakfast.    . lisinopril (ZESTRIL) 5 MG tablet Take 5 mg by mouth 2 (two) times daily.     No current facility-administered medications for this visit.    Review of Systems: No chest pain. No shortness of breath. No urinary complaints.   PHYSICAL EXAM :    Wt Readings from Last 3 Encounters:  04/26/21 161 lb 2 oz (73.1 kg)  04/02/21 161 lb (73 kg)  02/05/21 160 lb 9.6 oz (72.8 kg)    BP 110/70 (BP Location: Left Arm, Patient Position: Sitting, Cuff Size: Normal)   Pulse 76   Ht 5' 2.25" (1.581 m) Comment: height measured wihtout shoes  Wt 161 lb 2 oz (73.1 kg)   LMP 12/30/2010   BMI 29.23 kg/m  Constitutional:  Pleasant female in no acute distress. Psychiatric: Normal mood and affect. Behavior is normal. EENT: Pupils normal.  Conjunctivae are normal. No scleral icterus. Neck supple.  Cardiovascular: Normal rate, regular rhythm. No edema Pulmonary/chest: Effort normal and breath sounds normal. No wheezing, rales or rhonchi. Abdominal: Soft, nondistended, mild-moderate LLQ tenderness.  Bowel sounds active throughout. There are no masses palpable. No hepatomegaly. Neurological: Alert and oriented to person place and time. Skin: Skin is warm and dry. No rashes noted.  Tye Savoy, NP  04/26/2021, 3:29 PM  Cc:  Debbrah Alar, NP

## 2021-04-27 ENCOUNTER — Encounter: Payer: Self-pay | Admitting: Nurse Practitioner

## 2021-04-27 NOTE — Progress Notes (Signed)
GI nurse practitioner assessment and plans noted

## 2021-04-29 LAB — GI PROFILE, STOOL, PCR

## 2021-05-02 ENCOUNTER — Other Ambulatory Visit: Payer: Self-pay

## 2021-05-02 ENCOUNTER — Telehealth: Payer: Self-pay

## 2021-05-02 MED ORDER — DICYCLOMINE HCL 10 MG PO CAPS: 10.0000 mg | ORAL_CAPSULE | Freq: Two times a day (BID) | ORAL | 0 refills | Status: DC | PRN

## 2021-05-02 NOTE — Telephone Encounter (Signed)
Spoke with Bailey Harris. Advised of the findings on the labs and imaging. She is still having her symptoms. Agrees to the plan of care as stated. Rx to [facility].

## 2021-05-02 NOTE — Telephone Encounter (Signed)
-----   Message from Willia Craze, NP sent at 05/01/2021  5:59 PM EDT ----- Eustaquio Maize, please call Mr. Ramo's, patient's husband.  He speaks [place].  Let him know that the labs and stool studies are normal.  The CT scan by her primary care doctor did not show anything either.  It is not clear at this point what is going on.  I would recommend a trial of dicyclomine 10 mg twice daily as needed for abdominal pain.  If she is having diarrhea then recommend Imodium 2 mg 3 times daily.  She can call us back in a couple of weeks with an update.  Thank you

## 2021-05-25 ENCOUNTER — Other Ambulatory Visit: Payer: Self-pay | Admitting: Nurse Practitioner

## 2021-06-20 ENCOUNTER — Other Ambulatory Visit: Payer: Self-pay | Admitting: Nurse Practitioner

## 2021-06-26 ENCOUNTER — Other Ambulatory Visit: Payer: Self-pay

## 2021-06-26 ENCOUNTER — Telehealth: Payer: Self-pay

## 2021-06-26 ENCOUNTER — Telehealth: Payer: BC Managed Care – PPO | Admitting: Family

## 2021-06-26 NOTE — Telephone Encounter (Signed)
Patient had virtual appointment scheduled with Beverely Risen. Today at 11 am but  CMA was not able to reach patient when she tried. Spoke to husband to see if we can get her seen at a different time. He reports patient had significant cough last night but better today and they will like to wait and see if she continues to improve. They were advise to get a covid test for patient and to call us back if they think she needs to go back on the schedule. (This conversation was in [place]).

## 2021-07-20 ENCOUNTER — Other Ambulatory Visit: Payer: Self-pay | Admitting: Family

## 2021-07-21 ENCOUNTER — Other Ambulatory Visit: Payer: Self-pay | Admitting: Gastroenterology

## 2021-07-27 ENCOUNTER — Other Ambulatory Visit: Payer: Self-pay | Admitting: Family

## 2021-08-16 ENCOUNTER — Other Ambulatory Visit: Payer: Self-pay | Admitting: Gastroenterology

## 2021-08-28 ENCOUNTER — Other Ambulatory Visit: Payer: Self-pay | Admitting: Family

## 2021-10-06 ENCOUNTER — Other Ambulatory Visit: Payer: Self-pay | Admitting: Family

## 2021-10-07 ENCOUNTER — Telehealth: Payer: Self-pay | Admitting: Family

## 2021-10-07 NOTE — Telephone Encounter (Signed)
Please contact pt to schedule a follow up visit.  

## 2021-10-08 NOTE — Telephone Encounter (Signed)
Patient scheduled for 11-18-2021 due to new insurance not effective yet.

## 2021-11-19 ENCOUNTER — Ambulatory Visit: Payer: Self-pay | Admitting: Family

## 2023-04-14 ENCOUNTER — Encounter: Payer: Self-pay | Admitting: *Deleted

## 2023-04-28 ENCOUNTER — Encounter: Payer: Self-pay | Admitting: Internal Medicine
